# Patient Record
Sex: Male | Born: 1960 | Race: White | Hispanic: No | Marital: Single | Smoking: Former smoker
Health system: Southern US, Community
[De-identification: ages and names within clinical notes are randomized; demographics above are authoritative.]

## PROBLEM LIST (undated history)

## (undated) DIAGNOSIS — J449 Chronic obstructive pulmonary disease, unspecified: Secondary | ICD-10-CM

## (undated) DIAGNOSIS — I10 Essential (primary) hypertension: Secondary | ICD-10-CM

## (undated) HISTORY — PX: EYE SURGERY: SHX253

---

## 2004-10-28 ENCOUNTER — Ambulatory Visit (HOSPITAL_COMMUNITY): Admission: RE | Admit: 2004-10-28 | Discharge: 2004-10-28 | Payer: Self-pay | Admitting: Gastroenterology

## 2010-10-02 ENCOUNTER — Other Ambulatory Visit: Payer: Self-pay | Admitting: Family Medicine

## 2014-04-05 ENCOUNTER — Other Ambulatory Visit: Payer: Self-pay | Admitting: Gastroenterology

## 2016-07-31 DIAGNOSIS — H401131 Primary open-angle glaucoma, bilateral, mild stage: Secondary | ICD-10-CM | POA: Diagnosis not present

## 2016-08-04 DIAGNOSIS — H401134 Primary open-angle glaucoma, bilateral, indeterminate stage: Secondary | ICD-10-CM | POA: Diagnosis not present

## 2016-08-04 DIAGNOSIS — Z961 Presence of intraocular lens: Secondary | ICD-10-CM | POA: Diagnosis not present

## 2016-08-04 DIAGNOSIS — H2512 Age-related nuclear cataract, left eye: Secondary | ICD-10-CM | POA: Diagnosis not present

## 2016-09-10 DIAGNOSIS — H2512 Age-related nuclear cataract, left eye: Secondary | ICD-10-CM | POA: Diagnosis not present

## 2016-09-10 DIAGNOSIS — H401131 Primary open-angle glaucoma, bilateral, mild stage: Secondary | ICD-10-CM | POA: Diagnosis not present

## 2016-09-10 DIAGNOSIS — Z961 Presence of intraocular lens: Secondary | ICD-10-CM | POA: Diagnosis not present

## 2016-10-28 DIAGNOSIS — Z0000 Encounter for general adult medical examination without abnormal findings: Secondary | ICD-10-CM | POA: Diagnosis not present

## 2016-10-30 DIAGNOSIS — E7800 Pure hypercholesterolemia, unspecified: Secondary | ICD-10-CM | POA: Diagnosis not present

## 2016-11-29 DIAGNOSIS — Z23 Encounter for immunization: Secondary | ICD-10-CM | POA: Diagnosis not present

## 2017-03-24 DIAGNOSIS — L719 Rosacea, unspecified: Secondary | ICD-10-CM | POA: Diagnosis not present

## 2017-03-24 DIAGNOSIS — D1801 Hemangioma of skin and subcutaneous tissue: Secondary | ICD-10-CM | POA: Diagnosis not present

## 2017-03-25 ENCOUNTER — Other Ambulatory Visit: Payer: Self-pay | Admitting: Family Medicine

## 2017-03-25 DIAGNOSIS — D1801 Hemangioma of skin and subcutaneous tissue: Secondary | ICD-10-CM | POA: Diagnosis not present

## 2017-06-26 DIAGNOSIS — Z23 Encounter for immunization: Secondary | ICD-10-CM | POA: Diagnosis not present

## 2017-07-30 DIAGNOSIS — Z23 Encounter for immunization: Secondary | ICD-10-CM | POA: Diagnosis not present

## 2017-07-30 DIAGNOSIS — J3089 Other allergic rhinitis: Secondary | ICD-10-CM | POA: Diagnosis not present

## 2017-10-05 DIAGNOSIS — J040 Acute laryngitis: Secondary | ICD-10-CM | POA: Diagnosis not present

## 2017-11-03 DIAGNOSIS — I10 Essential (primary) hypertension: Secondary | ICD-10-CM | POA: Diagnosis not present

## 2017-11-03 DIAGNOSIS — M7711 Lateral epicondylitis, right elbow: Secondary | ICD-10-CM | POA: Diagnosis not present

## 2017-11-03 DIAGNOSIS — R05 Cough: Secondary | ICD-10-CM | POA: Diagnosis not present

## 2017-11-03 DIAGNOSIS — Z0000 Encounter for general adult medical examination without abnormal findings: Secondary | ICD-10-CM | POA: Diagnosis not present

## 2017-11-03 DIAGNOSIS — L719 Rosacea, unspecified: Secondary | ICD-10-CM | POA: Diagnosis not present

## 2017-11-08 DIAGNOSIS — E7800 Pure hypercholesterolemia, unspecified: Secondary | ICD-10-CM | POA: Diagnosis not present

## 2017-11-08 DIAGNOSIS — I10 Essential (primary) hypertension: Secondary | ICD-10-CM | POA: Diagnosis not present

## 2017-11-30 DIAGNOSIS — H401131 Primary open-angle glaucoma, bilateral, mild stage: Secondary | ICD-10-CM | POA: Diagnosis not present

## 2017-12-15 DIAGNOSIS — Z961 Presence of intraocular lens: Secondary | ICD-10-CM | POA: Diagnosis not present

## 2017-12-15 DIAGNOSIS — H25012 Cortical age-related cataract, left eye: Secondary | ICD-10-CM | POA: Diagnosis not present

## 2017-12-15 DIAGNOSIS — H2512 Age-related nuclear cataract, left eye: Secondary | ICD-10-CM | POA: Diagnosis not present

## 2018-03-02 DIAGNOSIS — H5212 Myopia, left eye: Secondary | ICD-10-CM | POA: Diagnosis not present

## 2018-03-02 DIAGNOSIS — H25812 Combined forms of age-related cataract, left eye: Secondary | ICD-10-CM | POA: Diagnosis not present

## 2018-03-02 DIAGNOSIS — H25012 Cortical age-related cataract, left eye: Secondary | ICD-10-CM | POA: Diagnosis not present

## 2018-03-02 DIAGNOSIS — H2512 Age-related nuclear cataract, left eye: Secondary | ICD-10-CM | POA: Diagnosis not present

## 2018-03-02 DIAGNOSIS — H52222 Regular astigmatism, left eye: Secondary | ICD-10-CM | POA: Diagnosis not present

## 2018-03-02 DIAGNOSIS — H25042 Posterior subcapsular polar age-related cataract, left eye: Secondary | ICD-10-CM | POA: Diagnosis not present

## 2018-11-30 DIAGNOSIS — E7800 Pure hypercholesterolemia, unspecified: Secondary | ICD-10-CM | POA: Diagnosis not present

## 2018-11-30 DIAGNOSIS — G250 Essential tremor: Secondary | ICD-10-CM | POA: Diagnosis not present

## 2018-11-30 DIAGNOSIS — L719 Rosacea, unspecified: Secondary | ICD-10-CM | POA: Diagnosis not present

## 2018-11-30 DIAGNOSIS — Z23 Encounter for immunization: Secondary | ICD-10-CM | POA: Diagnosis not present

## 2018-11-30 DIAGNOSIS — I10 Essential (primary) hypertension: Secondary | ICD-10-CM | POA: Diagnosis not present

## 2018-11-30 DIAGNOSIS — Z125 Encounter for screening for malignant neoplasm of prostate: Secondary | ICD-10-CM | POA: Diagnosis not present

## 2018-11-30 DIAGNOSIS — Z0000 Encounter for general adult medical examination without abnormal findings: Secondary | ICD-10-CM | POA: Diagnosis not present

## 2019-01-31 DIAGNOSIS — Z1159 Encounter for screening for other viral diseases: Secondary | ICD-10-CM | POA: Diagnosis not present

## 2019-05-02 DIAGNOSIS — Z23 Encounter for immunization: Secondary | ICD-10-CM | POA: Diagnosis not present

## 2019-10-05 DIAGNOSIS — R0789 Other chest pain: Secondary | ICD-10-CM | POA: Diagnosis not present

## 2019-10-05 DIAGNOSIS — Z79899 Other long term (current) drug therapy: Secondary | ICD-10-CM | POA: Diagnosis not present

## 2019-10-05 DIAGNOSIS — J984 Other disorders of lung: Secondary | ICD-10-CM | POA: Diagnosis not present

## 2019-10-05 DIAGNOSIS — R079 Chest pain, unspecified: Secondary | ICD-10-CM | POA: Diagnosis not present

## 2019-10-05 DIAGNOSIS — S2232XA Fracture of one rib, left side, initial encounter for closed fracture: Secondary | ICD-10-CM | POA: Diagnosis not present

## 2019-10-05 DIAGNOSIS — Z20822 Contact with and (suspected) exposure to covid-19: Secondary | ICD-10-CM | POA: Diagnosis not present

## 2019-10-05 DIAGNOSIS — J9383 Other pneumothorax: Secondary | ICD-10-CM | POA: Diagnosis not present

## 2019-10-05 DIAGNOSIS — Z7982 Long term (current) use of aspirin: Secondary | ICD-10-CM | POA: Diagnosis not present

## 2019-10-05 DIAGNOSIS — I10 Essential (primary) hypertension: Secondary | ICD-10-CM | POA: Diagnosis not present

## 2019-10-05 DIAGNOSIS — Z87891 Personal history of nicotine dependence: Secondary | ICD-10-CM | POA: Diagnosis not present

## 2019-10-05 DIAGNOSIS — I4510 Unspecified right bundle-branch block: Secondary | ICD-10-CM | POA: Diagnosis not present

## 2019-10-05 DIAGNOSIS — X58XXXA Exposure to other specified factors, initial encounter: Secondary | ICD-10-CM | POA: Diagnosis not present

## 2019-10-05 DIAGNOSIS — J449 Chronic obstructive pulmonary disease, unspecified: Secondary | ICD-10-CM | POA: Diagnosis not present

## 2019-10-06 DIAGNOSIS — J939 Pneumothorax, unspecified: Secondary | ICD-10-CM | POA: Diagnosis not present

## 2019-10-06 DIAGNOSIS — J9383 Other pneumothorax: Secondary | ICD-10-CM | POA: Diagnosis not present

## 2019-10-06 DIAGNOSIS — J449 Chronic obstructive pulmonary disease, unspecified: Secondary | ICD-10-CM | POA: Diagnosis not present

## 2019-10-18 DIAGNOSIS — J939 Pneumothorax, unspecified: Secondary | ICD-10-CM | POA: Diagnosis not present

## 2019-11-30 DIAGNOSIS — Z13220 Encounter for screening for lipoid disorders: Secondary | ICD-10-CM | POA: Diagnosis not present

## 2019-11-30 DIAGNOSIS — Z0000 Encounter for general adult medical examination without abnormal findings: Secondary | ICD-10-CM | POA: Diagnosis not present

## 2019-11-30 DIAGNOSIS — Z125 Encounter for screening for malignant neoplasm of prostate: Secondary | ICD-10-CM | POA: Diagnosis not present

## 2019-12-05 DIAGNOSIS — G250 Essential tremor: Secondary | ICD-10-CM | POA: Diagnosis not present

## 2019-12-05 DIAGNOSIS — Z0000 Encounter for general adult medical examination without abnormal findings: Secondary | ICD-10-CM | POA: Diagnosis not present

## 2019-12-05 DIAGNOSIS — M25562 Pain in left knee: Secondary | ICD-10-CM | POA: Diagnosis not present

## 2019-12-05 DIAGNOSIS — L719 Rosacea, unspecified: Secondary | ICD-10-CM | POA: Diagnosis not present

## 2019-12-05 DIAGNOSIS — I10 Essential (primary) hypertension: Secondary | ICD-10-CM | POA: Diagnosis not present

## 2020-02-12 DIAGNOSIS — H524 Presbyopia: Secondary | ICD-10-CM | POA: Diagnosis not present

## 2020-05-14 DIAGNOSIS — Z23 Encounter for immunization: Secondary | ICD-10-CM | POA: Diagnosis not present

## 2020-09-30 DIAGNOSIS — H33012 Retinal detachment with single break, left eye: Secondary | ICD-10-CM | POA: Diagnosis not present

## 2020-09-30 DIAGNOSIS — H35413 Lattice degeneration of retina, bilateral: Secondary | ICD-10-CM | POA: Diagnosis not present

## 2020-10-02 DIAGNOSIS — Z7982 Long term (current) use of aspirin: Secondary | ICD-10-CM | POA: Diagnosis not present

## 2020-10-02 DIAGNOSIS — Z961 Presence of intraocular lens: Secondary | ICD-10-CM | POA: Diagnosis not present

## 2020-10-02 DIAGNOSIS — H35413 Lattice degeneration of retina, bilateral: Secondary | ICD-10-CM | POA: Diagnosis not present

## 2020-10-02 DIAGNOSIS — Z20822 Contact with and (suspected) exposure to covid-19: Secondary | ICD-10-CM | POA: Diagnosis not present

## 2020-10-02 DIAGNOSIS — I10 Essential (primary) hypertension: Secondary | ICD-10-CM | POA: Diagnosis not present

## 2020-10-02 DIAGNOSIS — Z9841 Cataract extraction status, right eye: Secondary | ICD-10-CM | POA: Diagnosis not present

## 2020-10-02 DIAGNOSIS — Z79899 Other long term (current) drug therapy: Secondary | ICD-10-CM | POA: Diagnosis not present

## 2020-10-02 DIAGNOSIS — Z9842 Cataract extraction status, left eye: Secondary | ICD-10-CM | POA: Diagnosis not present

## 2020-10-02 DIAGNOSIS — H3342 Traction detachment of retina, left eye: Secondary | ICD-10-CM | POA: Diagnosis not present

## 2020-10-02 DIAGNOSIS — Z87891 Personal history of nicotine dependence: Secondary | ICD-10-CM | POA: Diagnosis not present

## 2020-10-02 DIAGNOSIS — H5462 Unqualified visual loss, left eye, normal vision right eye: Secondary | ICD-10-CM | POA: Diagnosis not present

## 2020-10-03 DIAGNOSIS — H3322 Serous retinal detachment, left eye: Secondary | ICD-10-CM | POA: Diagnosis not present

## 2020-10-03 DIAGNOSIS — H5462 Unqualified visual loss, left eye, normal vision right eye: Secondary | ICD-10-CM | POA: Diagnosis not present

## 2020-10-03 DIAGNOSIS — Z7982 Long term (current) use of aspirin: Secondary | ICD-10-CM | POA: Diagnosis not present

## 2020-10-03 DIAGNOSIS — Z20822 Contact with and (suspected) exposure to covid-19: Secondary | ICD-10-CM | POA: Diagnosis not present

## 2020-10-03 DIAGNOSIS — Z961 Presence of intraocular lens: Secondary | ICD-10-CM | POA: Diagnosis not present

## 2020-10-03 DIAGNOSIS — H3342 Traction detachment of retina, left eye: Secondary | ICD-10-CM | POA: Diagnosis not present

## 2020-10-03 DIAGNOSIS — Z9841 Cataract extraction status, right eye: Secondary | ICD-10-CM | POA: Diagnosis not present

## 2020-10-03 DIAGNOSIS — H35413 Lattice degeneration of retina, bilateral: Secondary | ICD-10-CM | POA: Diagnosis not present

## 2020-10-03 DIAGNOSIS — Z87891 Personal history of nicotine dependence: Secondary | ICD-10-CM | POA: Diagnosis not present

## 2020-10-03 DIAGNOSIS — I10 Essential (primary) hypertension: Secondary | ICD-10-CM | POA: Diagnosis not present

## 2020-10-03 DIAGNOSIS — Z9842 Cataract extraction status, left eye: Secondary | ICD-10-CM | POA: Diagnosis not present

## 2020-10-03 DIAGNOSIS — Z79899 Other long term (current) drug therapy: Secondary | ICD-10-CM | POA: Diagnosis not present

## 2020-10-03 DIAGNOSIS — H33012 Retinal detachment with single break, left eye: Secondary | ICD-10-CM | POA: Diagnosis not present

## 2020-10-03 DIAGNOSIS — H35412 Lattice degeneration of retina, left eye: Secondary | ICD-10-CM | POA: Diagnosis not present

## 2020-11-05 DIAGNOSIS — Z87891 Personal history of nicotine dependence: Secondary | ICD-10-CM | POA: Diagnosis not present

## 2020-11-05 DIAGNOSIS — Z9841 Cataract extraction status, right eye: Secondary | ICD-10-CM | POA: Diagnosis not present

## 2020-11-05 DIAGNOSIS — Z961 Presence of intraocular lens: Secondary | ICD-10-CM | POA: Diagnosis not present

## 2020-11-05 DIAGNOSIS — Z20822 Contact with and (suspected) exposure to covid-19: Secondary | ICD-10-CM | POA: Diagnosis not present

## 2020-11-05 DIAGNOSIS — Z79899 Other long term (current) drug therapy: Secondary | ICD-10-CM | POA: Diagnosis not present

## 2020-11-05 DIAGNOSIS — Z7952 Long term (current) use of systemic steroids: Secondary | ICD-10-CM | POA: Diagnosis not present

## 2020-11-05 DIAGNOSIS — H35412 Lattice degeneration of retina, left eye: Secondary | ICD-10-CM | POA: Diagnosis not present

## 2020-11-05 DIAGNOSIS — H3342 Traction detachment of retina, left eye: Secondary | ICD-10-CM | POA: Diagnosis not present

## 2020-11-05 DIAGNOSIS — Z9842 Cataract extraction status, left eye: Secondary | ICD-10-CM | POA: Diagnosis not present

## 2020-11-05 DIAGNOSIS — H409 Unspecified glaucoma: Secondary | ICD-10-CM | POA: Diagnosis not present

## 2020-11-05 DIAGNOSIS — I10 Essential (primary) hypertension: Secondary | ICD-10-CM | POA: Diagnosis not present

## 2020-11-05 DIAGNOSIS — Z7982 Long term (current) use of aspirin: Secondary | ICD-10-CM | POA: Diagnosis not present

## 2020-11-07 DIAGNOSIS — H33022 Retinal detachment with multiple breaks, left eye: Secondary | ICD-10-CM | POA: Diagnosis not present

## 2020-11-07 DIAGNOSIS — Z961 Presence of intraocular lens: Secondary | ICD-10-CM | POA: Diagnosis not present

## 2020-11-07 DIAGNOSIS — H35412 Lattice degeneration of retina, left eye: Secondary | ICD-10-CM | POA: Diagnosis not present

## 2020-11-07 DIAGNOSIS — Z9841 Cataract extraction status, right eye: Secondary | ICD-10-CM | POA: Diagnosis not present

## 2020-11-07 DIAGNOSIS — Z20822 Contact with and (suspected) exposure to covid-19: Secondary | ICD-10-CM | POA: Diagnosis not present

## 2020-11-07 DIAGNOSIS — H3342 Traction detachment of retina, left eye: Secondary | ICD-10-CM | POA: Diagnosis not present

## 2020-11-07 DIAGNOSIS — I10 Essential (primary) hypertension: Secondary | ICD-10-CM | POA: Diagnosis not present

## 2020-11-07 DIAGNOSIS — H409 Unspecified glaucoma: Secondary | ICD-10-CM | POA: Diagnosis not present

## 2020-11-07 DIAGNOSIS — Z87891 Personal history of nicotine dependence: Secondary | ICD-10-CM | POA: Diagnosis not present

## 2020-11-07 DIAGNOSIS — H3341 Traction detachment of retina, right eye: Secondary | ICD-10-CM | POA: Diagnosis not present

## 2020-11-07 DIAGNOSIS — Z7952 Long term (current) use of systemic steroids: Secondary | ICD-10-CM | POA: Diagnosis not present

## 2020-11-07 DIAGNOSIS — Z7982 Long term (current) use of aspirin: Secondary | ICD-10-CM | POA: Diagnosis not present

## 2020-11-07 DIAGNOSIS — Z9842 Cataract extraction status, left eye: Secondary | ICD-10-CM | POA: Diagnosis not present

## 2020-11-07 DIAGNOSIS — Z79899 Other long term (current) drug therapy: Secondary | ICD-10-CM | POA: Diagnosis not present

## 2020-11-21 DIAGNOSIS — H40032 Anatomical narrow angle, left eye: Secondary | ICD-10-CM | POA: Diagnosis not present

## 2020-11-21 DIAGNOSIS — H26492 Other secondary cataract, left eye: Secondary | ICD-10-CM | POA: Diagnosis not present

## 2020-12-12 DIAGNOSIS — Z0000 Encounter for general adult medical examination without abnormal findings: Secondary | ICD-10-CM | POA: Diagnosis not present

## 2020-12-12 DIAGNOSIS — Z13220 Encounter for screening for lipoid disorders: Secondary | ICD-10-CM | POA: Diagnosis not present

## 2020-12-12 DIAGNOSIS — E559 Vitamin D deficiency, unspecified: Secondary | ICD-10-CM | POA: Diagnosis not present

## 2020-12-12 DIAGNOSIS — Z125 Encounter for screening for malignant neoplasm of prostate: Secondary | ICD-10-CM | POA: Diagnosis not present

## 2020-12-19 DIAGNOSIS — Z0000 Encounter for general adult medical examination without abnormal findings: Secondary | ICD-10-CM | POA: Diagnosis not present

## 2020-12-19 DIAGNOSIS — E538 Deficiency of other specified B group vitamins: Secondary | ICD-10-CM | POA: Diagnosis not present

## 2020-12-19 DIAGNOSIS — I10 Essential (primary) hypertension: Secondary | ICD-10-CM | POA: Diagnosis not present

## 2020-12-19 DIAGNOSIS — E559 Vitamin D deficiency, unspecified: Secondary | ICD-10-CM | POA: Diagnosis not present

## 2020-12-19 DIAGNOSIS — E7800 Pure hypercholesterolemia, unspecified: Secondary | ICD-10-CM | POA: Diagnosis not present

## 2020-12-30 ENCOUNTER — Other Ambulatory Visit: Payer: Self-pay | Admitting: *Deleted

## 2020-12-30 DIAGNOSIS — Z87891 Personal history of nicotine dependence: Secondary | ICD-10-CM

## 2021-02-05 ENCOUNTER — Other Ambulatory Visit: Payer: Self-pay

## 2021-02-05 ENCOUNTER — Ambulatory Visit
Admission: RE | Admit: 2021-02-05 | Discharge: 2021-02-05 | Disposition: A | Discharge status: Home / Self Care | Payer: BC Managed Care – PPO | Source: Ambulatory Visit | Attending: Acute Care | Admitting: Acute Care

## 2021-02-05 ENCOUNTER — Ambulatory Visit (INDEPENDENT_AMBULATORY_CARE_PROVIDER_SITE_OTHER): Payer: BC Managed Care – PPO | Admitting: Acute Care

## 2021-02-05 ENCOUNTER — Encounter: Payer: Self-pay | Admitting: Acute Care

## 2021-02-05 VITALS — BP 124/78 | HR 87 | Temp 97.3°F | Ht 70.0 in | Wt 160.0 lb

## 2021-02-05 DIAGNOSIS — Z87891 Personal history of nicotine dependence: Secondary | ICD-10-CM

## 2021-02-05 NOTE — Progress Notes (Signed)
Shared Decision Making Visit Lung Cancer Screening Program [phone])   Eligibility: Age 60 y.o. Pack Years Smoking History Calculation 35 pack year smoking history (# packs/per year x # years smoked) Recent History of coughing up blood  no Unexplained weight loss? no ( >Than 15 pounds within the last 6 months ) Prior History Lung / other cancer no (Diagnosis within the last 5 years already requiring surveillance chest CT Scans). Smoking Status Former Smoker Former Smokers: Years since quit: 8 years  Quit Date: 2014  Visit Components: Discussion included one or more decision making aids. yes Discussion included risk/benefits of screening. yes Discussion included potential follow up diagnostic testing for abnormal scans. yes Discussion included meaning and risk of over diagnosis. yes Discussion included meaning and risk of False Positives. yes Discussion included meaning of total radiation exposure. yes  Counseling Included: Importance of adherence to annual lung cancer LDCT screening. yes Impact of comorbidities on ability to participate in the program. yes Ability and willingness to under diagnostic treatment. yes  Smoking Cessation Counseling: Current Smokers:  Discussed importance of smoking cessation. yes Information about tobacco cessation classes and interventions provided to patient. yes Patient provided with "ticket" for LDCT Scan. yes Symptomatic Patient. no  Counseling Diagnosis Code: Tobacco Use Z72.0 Asymptomatic Patient yes  Counseling (Intermediate counseling: > three minutes counseling) U0454 Former Smokers:  Discussed the importance of maintaining cigarette abstinence. yes Diagnosis Code: Personal History of Nicotine Dependence. U98.119 Information about tobacco cessation classes and interventions provided to patient. Yes Patient provided with "ticket" for LDCT Scan. yes Written Order for Lung Cancer Screening with LDCT placed in Epic. Yes (CT Chest Lung  Cancer Screening Low Dose W/O CM) [id number] Z12.2-Screening of respiratory organs Z87.891-Personal history of nicotine dependence  I spent 25 minutes of face to face time with Thomas Kennedy discussing the risks and benefits of lung cancer screening. We viewed a power point together that explained in detail the above noted topics. We took the time to pause the power point at intervals to allow for questions to be asked and answered to ensure understanding. We discussed that he had taken the single most powerful action possible to decrease his risk of developing lung cancer when he quit smoking. I counseled him to remain smoke free, and to contact me if he ever had the desire to smoke again so that I can provide resources and tools to help support the effort to remain smoke free. We discussed the time and location of the scan, and that either  Doroteo Glassman RN or I will call with the results within  24-48 hours of receiving them. He has my card and contact information in the event he needs to speak with me, in addition to a copy of the power point we reviewed as a resource. He verbalized understanding of all of the above and had no further questions upon leaving the office.     I explained to the patient that there has been a high incidence of coronary artery disease noted on these exams. I explained that this is a non-gated exam therefore degree or severity cannot be determined. This patient is not currently on statin therapy. I have asked the patient to follow-up with their PCP regarding any incidental finding of coronary artery disease and management with diet or medication as they feel is clinically indicated. The patient verbalized understanding of the above and had no further questions.     Thomas Spatz, NP 02/05/2021

## 2021-02-05 NOTE — Patient Instructions (Signed)
Thank you for participating in the [facility]. It was our pleasure to meet you today. We will call you with the results of your scan within the next few days. Your scan will be assigned a Lung RADS category score by the physicians reading the scans.  This Lung RADS score determines follow up scanning.  See below for description of categories, and follow up screening recommendations. We will be in touch to schedule your follow up screening annually or based on recommendations of our providers. We will fax a copy of your scan results to your Primary Care Physician, or the physician who referred you to the program, to ensure they have the results. Please call the office if you have any questions or concerns regarding your scanning experience or results.  Our office number is [phone]. Please speak with Denise Phelps, RN. She is our Lung Cancer Screening RN. If she is unavailable when you call, please have the office staff send her a message. She will return your call at her earliest convenience. Remember, if your scan is normal, we will scan you annually as long as you continue to meet the criteria for the program. (Age 55-77, Current smoker or smoker who has quit within the last 15 years). If you are a smoker, remember, quitting is the single most powerful action that you can take to decrease your risk of lung cancer and other pulmonary, breathing related problems. We know quitting is hard, and we are here to help.  Please let us know if there is anything we can do to help you meet your goal of quitting. If you are a former smoker, congratulations. We are proud of you! Remain smoke free! Remember you can refer friends or family members through the number above.  We will screen them to make sure they meet criteria for the program. Thank you for helping us take better care of you by participating in Lung Screening.  Lung RADS Categories:  Lung RADS 1: no nodules  or definitely non-concerning nodules.  Recommendation is for a repeat annual scan in 12 months.  Lung RADS 2:  nodules that are non-concerning in appearance and behavior with a very low likelihood of becoming an active cancer. Recommendation is for a repeat annual scan in 12 months.  Lung RADS 3: nodules that are probably non-concerning , includes nodules with a low likelihood of becoming an active cancer.  Recommendation is for a 6-month repeat screening scan. Often noted after an upper respiratory illness. We will be in touch to make sure you have no questions, and to schedule your 6-month scan.  Lung RADS 4 A: nodules with concerning findings, recommendation is most often for a follow up scan in 3 months or additional testing based on our provider's assessment of the scan. We will be in touch to make sure you have no questions and to schedule the recommended 3 month follow up scan.  Lung RADS 4 B:  indicates findings that are concerning. We will be in touch with you to schedule additional diagnostic testing based on our provider's  assessment of the scan.   

## 2021-02-10 DIAGNOSIS — E538 Deficiency of other specified B group vitamins: Secondary | ICD-10-CM | POA: Diagnosis not present

## 2021-02-12 NOTE — Progress Notes (Signed)
Cardiology Office Note:    Date:  02/13/2021   ID:  Thomas Kennedy, DOB [birth year], MRN [id number]  PCP:  Lawerance Cruel, MD  Cardiologist:  None  Electrophysiologist:  None   Referring MD: Lawerance Cruel, MD   No chief complaint on file.   History of Present Illness:    Thomas Kennedy is a 60 y.o. male with tobacco use, hypertension who is referred by Dr. Harrington Challenger for evaluation of coronary calcifications on CT chest.  He states he has hypertension. He says when he takes his irbesartan 366 MG  his systolic blood pressure ranges 440H-474 and diastolic 25Z-56L. He experiences occasional shortness of breath after showering, hot flashes, and palpitations that comes once a week and lasts about 5-10 minutes. He feels his heart racing when he has these episodes. He also has occasional lightheadedness. He includes he had a presyncopal episode when he went to the grocery store and had to hold on to the shelf for support. Denies syncope, LE edema or chest pains. He use to exercise regularly for about 30-40 minutes with no complications however has stopped 5 months ago. His father died from bladder cancer. He notes his father was not taking medication. His grandmother died from a massive MI. He smoked one pack of cigarettes a day for 35 years however has stopped smoking 8 years ago.    No past medical history on file.  No past surgical history on file.  Current Medications: Current Meds  Medication Sig   brimonidine (ALPHAGAN) 0.2 % ophthalmic solution Apply to eye.   cyanocobalamin (,VITAMIN B-12,) 1000 MCG/ML injection Inject 1,000 mcg into the muscle every 30 (thirty) days.   irbesartan (AVAPRO) 150 MG tablet    latanoprost (XALATAN) 0.005 % ophthalmic solution 1 drop at bedtime.     Allergies:   Patient has no allergy information on record.   Social History   Socioeconomic History   Marital status: Single    Spouse name: Not on file   Number of children: Not on file   Years of  education: Not on file   Highest education level: Not on file  Occupational History   Not on file  Tobacco Use   Smoking status: Former    Types: Cigarettes    Quit date: 2014    Years since quitting: 8.5   Smokeless tobacco: Former  Substance and Sexual Activity   Alcohol use: Not on file   Drug use: Not on file   Sexual activity: Not on file  Other Topics Concern   Not on file  Social History Narrative   Not on file   Social Determinants of Health   Financial Resource Strain: Not on file  Food Insecurity: Not on file  Transportation Needs: Not on file  Physical Activity: Not on file  Stress: Not on file  Social Connections: Not on file     Family History: The patient's family history is not on file.  ROS:   Please see the history of present illness.    (+)lightheadedness (+) hot flashes (+)shortness of breath  (+) palpitations  All other systems reviewed and are negative.  EKGs/Labs/Other Studies Reviewed:    No prior CV studies available.  EKG:   07/22: sinus rhythm, rate 67, no ST abnormalities, Q-wave in D1 and D2   Recent Labs: No results found for requested labs within last 8760 hours.  Recent Lipid Panel No results found for: CHOL, TRIG, HDL, CHOLHDL, VLDL, LDLCALC, LDLDIRECT  Physical  Exam:    VS:  BP 112/90 (BP Location: Right Arm)   Pulse 67   Ht 5\' 10"  (1.778 m)   Wt 160 lb 3.2 oz (72.7 kg)   SpO2 98%   BMI 22.99 kg/m     Wt Readings from Last 3 Encounters:  02/13/21 160 lb 3.2 oz (72.7 kg)  02/05/21 160 lb (72.6 kg)     GEN:  Well nourished, well developed in no acute distress HEENT: Normal NECK: No JVD; No carotid bruits LYMPHATICS: No lymphadenopathy CARDIAC: RRR, no murmurs, rubs, gallops RESPIRATORY:  Clear to auscultation without rales, wheezing or rhonchi  ABDOMEN: Soft, non-tender, non-distended MUSCULOSKELETAL:  No edema; No deformity  SKIN: Warm and dry NEUROLOGIC:  Alert and oriented x 3 PSYCHIATRIC:  Normal affect    ASSESSMENT:    1. Shortness of breath   2. Abnormal EKG   3. Aortic dilatation (HCC)   4. Palpitations   5. Hyperlipidemia, unspecified hyperlipidemia type    PLAN:    Dyspnea: Reports intermittent dyspnea, EKG with Q waves in V1/2.  Will check echocardiogram   Hyperlipidemia: LDL 132 on 12/12/2020.  10-year ASCVD risk score 5%.  Does not meet indication for statin at this time but did have coronary calcifications on recent CT chest.  Will check calcium score and plan to start statin if elevated calcium score  Aortic dilatation: Ascending aorta measured 40 mm on recent CT chest, will repeat CTA chest in 1 year to monitor  Palpitations: Description concerning for arrhythmia, evaluate with Zio patch x2 weeks  RTC in 6 months   Medication Adjustments/Labs and Tests Ordered: Current medicines are reviewed at length with the patient today.  Concerns regarding medicines are outlined above.  Orders Placed This Encounter  Procedures   CT CARDIAC SCORING (SELF PAY ONLY)   CT ANGIO CHEST AORTA W/CM & OR WO/CM   LONG TERM MONITOR (3-14 DAYS)   EKG 12-Lead   ECHOCARDIOGRAM COMPLETE    No orders of the defined types were placed in this encounter.   Patient Instructions  Medication Instructions:  Your physician recommends that you continue on your current medications as directed. Please refer to the Current Medication list given to you today.  *If you need a refill on your cardiac medications before your next appointment, please call your pharmacy*  Testing/Procedures: Your physician has requested that you have an echocardiogram. Echocardiography is a painless test that uses sound waves to create images of your heart. It provides your doctor with information about the size and shape of your heart and how well your heart's chambers and valves are working. This procedure takes approximately one hour. There are no restrictions for this procedure. This will be done at our [facility]  location:  [address]  CT coronary calcium score. This test is done at [address]. This is $99 out of pocket.   Coronary CalciumScan A coronary calcium scan is an imaging test used to look for deposits of calcium and other fatty materials (plaques) in the inner lining of the blood vessels of the heart (coronary arteries). These deposits of calcium and plaques can partly clog and narrow the coronary arteries without producing any symptoms or warning signs. This puts a person at risk for a heart attack. This test can detect these deposits before symptoms develop. Tell a health care provider about: Any allergies you have. All medicines you are taking, including vitamins, herbs, eye drops, creams, and over-the-counter  medicines. Any problems you or family members have had with anesthetic medicines. Any blood disorders you have. Any surgeries you have had. Any medical conditions you have. Whether you are pregnant or may be pregnant. What are the risks? Generally, this is a safe procedure. However, problems may occur, including: Harm to a pregnant woman and her unborn baby. This test involves the use of radiation. Radiation exposure can be dangerous to a pregnant woman and her unborn baby. If you are pregnant, you generally should not have this procedure done. Slight increase in the risk of cancer. This is because of the radiation involved in the test. What happens before the procedure? No preparation is needed for this procedure. What happens during the procedure? You will undress and remove any jewelry around your neck or chest. You will put on a hospital gown. Sticky electrodes will be placed on your chest. The electrodes will be connected to an electrocardiogram (ECG) machine to record a tracing of the electrical activity of your heart. A CT scanner will take pictures of your heart. During this time, you will be asked to lie still and hold your breath  for 2-3 seconds while a picture of your heart is being taken. The procedure may vary among health care providers and hospitals. What happens after the procedure? You can get dressed. You can return to your normal activities. It is up to you to get the results of your test. Ask your health care provider, or the department that is doing the test, when your results will be ready. Summary A coronary calcium scan is an imaging test used to look for deposits of calcium and other fatty materials (plaques) in the inner lining of the blood vessels of the heart (coronary arteries). Generally, this is a safe procedure. Tell your health care provider if you are pregnant or may be pregnant. No preparation is needed for this procedure. A CT scanner will take pictures of your heart. You can return to your normal activities after the scan is done. This information is not intended to replace advice given to you by your health care provider. Make sure you discuss any questions you have with your health care provider. Document Released: 01/09/2008 Document Revised: 06/01/2016 Document Reviewed: 06/01/2016 Elsevier Interactive Patient Education  2017 [place] [employer] Monitor Instructions   Your physician has requested you wear a ZIO patch monitor for _14__ days.  This is a single patch monitor.   [employer] supplies one patch monitor per enrollment. Additional stickers are not available. Please do not apply patch if you will be having a Nuclear Stress Test, Echocardiogram, Cardiac CT, MRI, or Chest Xray during the period you would be wearing the monitor. The patch cannot be worn during these tests. You cannot remove and re-apply the ZIO XT patch monitor.  Your ZIO patch monitor will be sent Fed Ex from [employer] directly to your home address. It may take 3-5 days to receive your monitor after you have been enrolled.  Once you have received your monitor, please review the enclosed  instructions. Your monitor has already been registered assigning a specific monitor serial # to you.  Billing and Patient Assistance Program Information   We have supplied [employer] with any of your insurance information on file for billing purposes. [employer] offers a sliding scale Patient Assistance Program for patients that do not have insurance, or whose insurance does not completely cover the cost of the ZIO monitor.   You must apply  for the Patient Assistance Program to qualify for this discounted rate.     To apply, please call [employer] at [phone], select option 4, then select option 2, and ask to apply for Patient Assistance Program.  Theodore Demark will ask your household income, and how many people are in your household.  They will quote your out-of-pocket cost based on that information.  [employer] will also be able to set up a 6-month, interest-free payment plan if needed.  Applying the monitor   Shave hair from upper left chest.  Hold abrader disc by orange tab. Rub abrader in 40 strokes over the upper left chest as indicated in your monitor instructions.  Clean area with 4 enclosed alcohol pads. Let dry.  Apply patch as indicated in monitor instructions. Patch will be placed under collarbone on left side of chest with arrow pointing upward.  Rub patch adhesive wings for 2 minutes. Remove white label marked "1". Remove the white label marked "2". Rub patch adhesive wings for 2 additional minutes.  While looking in a mirror, press and release button in center of patch. A small green light will flash 3-4 times. This will be your only indicator that the monitor has been turned on. ?  Do not shower for the first 24 hours. You may shower after the first 24 hours.  Press the button if you feel a symptom. You will hear a small click. Record Date, Time and Symptom in the Patient Logbook.  When you are ready to remove the patch, follow instructions on the last 2 pages of the Patient Logbook. Stick patch  monitor onto the last page of Patient Logbook.  Place Patient Logbook in the blue and white box.  Use locking tab on box and tape box closed securely.  The blue and white box has prepaid postage on it. Please place it in the mailbox as soon as possible. Your physician should have your test results approximately 7 days after the monitor has been mailed back to [facility].  Call [facility] at [phone] if you have questions regarding your ZIO XT patch monitor. Call them immediately if you see an orange light blinking on your monitor.  If your monitor falls off in less than 4 days, contact our Monitor department at [phone]. ?If your monitor becomes loose or falls off after 4 days call [employer] at [phone] for suggestions on securing your monitor.?   CTA chest/aorta due in 1 year-we will schedule this closer to due date  Follow-Up: At [facility], you and your health needs are our priority.  As part of our continuing mission to provide you with exceptional heart care, we have created designated Provider Care Teams.  These Care Teams include your primary Cardiologist (physician) and Advanced Practice Providers (APPs -  Physician Assistants and Nurse Practitioners) who all work together to provide you with the care you need, when you need it.  We recommend signing up for the patient portal called "MyChart".  Sign up information is provided on this After Visit Summary.  MyChart is used to connect with patients for Virtual Visits (Telemedicine).  Patients are able to view lab/test results, encounter notes, upcoming appointments, etc.  Non-urgent messages can be sent to your provider as well.   To learn more about what you can do with MyChart, go to [email or web address].    Your next appointment:   6 month(s)  The format for your next appointment:   In Person  Provider:   Oswaldo Milian, MD  I,Jada Bradford,acting as a Education administrator for Donato Heinz, MD.,have documented all relevant documentation on the behalf of Donato Heinz, MD,as directed by  Donato Heinz, MD while in the presence of Donato Heinz, MD.  I, Donato Heinz, MD, have reviewed all documentation for this visit. The documentation on 02/13/21 for the exam, diagnosis, procedures, and orders are all accurate and complete.   Signed, Donato Heinz, MD  02/13/2021 1:48 PM    [facility]

## 2021-02-13 ENCOUNTER — Ambulatory Visit (INDEPENDENT_AMBULATORY_CARE_PROVIDER_SITE_OTHER): Payer: BC Managed Care – PPO | Admitting: Cardiology

## 2021-02-13 ENCOUNTER — Other Ambulatory Visit: Payer: Self-pay

## 2021-02-13 ENCOUNTER — Ambulatory Visit (INDEPENDENT_AMBULATORY_CARE_PROVIDER_SITE_OTHER): Payer: BC Managed Care – PPO

## 2021-02-13 ENCOUNTER — Encounter: Payer: Self-pay | Admitting: Cardiology

## 2021-02-13 VITALS — BP 112/90 | HR 67 | Ht 70.0 in | Wt 160.2 lb

## 2021-02-13 DIAGNOSIS — R9431 Abnormal electrocardiogram [ECG] [EKG]: Secondary | ICD-10-CM | POA: Diagnosis not present

## 2021-02-13 DIAGNOSIS — R002 Palpitations: Secondary | ICD-10-CM

## 2021-02-13 DIAGNOSIS — I77819 Aortic ectasia, unspecified site: Secondary | ICD-10-CM

## 2021-02-13 DIAGNOSIS — E785 Hyperlipidemia, unspecified: Secondary | ICD-10-CM

## 2021-02-13 DIAGNOSIS — R0602 Shortness of breath: Secondary | ICD-10-CM | POA: Diagnosis not present

## 2021-02-13 NOTE — Progress Notes (Unsigned)
Enrolled patient for a 14 day Zio XT  monitor to be mailed to patients home  °

## 2021-02-13 NOTE — Patient Instructions (Addendum)
Medication Instructions:  Your physician recommends that you continue on your current medications as directed. Please refer to the Current Medication list given to you today.  *If you need a refill on your cardiac medications before your next appointment, please call your pharmacy*  Testing/Procedures: Your physician has requested that you have an echocardiogram. Echocardiography is a painless test that uses sound waves to create images of your heart. It provides your doctor with information about the size and shape of your heart and how well your heart's chambers and valves are working. This procedure takes approximately one hour. There are no restrictions for this procedure. This will be done at our [facility] location:  [address]  CT coronary calcium score. This test is done at [address]. This is $99 out of pocket.   Coronary CalciumScan A coronary calcium scan is an imaging test used to look for deposits of calcium and other fatty materials (plaques) in the inner lining of the blood vessels of the heart (coronary arteries). These deposits of calcium and plaques can partly clog and narrow the coronary arteries without producing any symptoms or warning signs. This puts a person at risk for a heart attack. This test can detect these deposits before symptoms develop. Tell a health care provider about: Any allergies you have. All medicines you are taking, including vitamins, herbs, eye drops, creams, and over-the-counter medicines. Any problems you or family members have had with anesthetic medicines. Any blood disorders you have. Any surgeries you have had. Any medical conditions you have. Whether you are pregnant or may be pregnant. What are the risks? Generally, this is a safe procedure. However, problems may occur, including: Harm to a pregnant woman and her unborn baby. This test involves the use of radiation. Radiation exposure can be  dangerous to a pregnant woman and her unborn baby. If you are pregnant, you generally should not have this procedure done. Slight increase in the risk of cancer. This is because of the radiation involved in the test. What happens before the procedure? No preparation is needed for this procedure. What happens during the procedure? You will undress and remove any jewelry around your neck or chest. You will put on a hospital gown. Sticky electrodes will be placed on your chest. The electrodes will be connected to an electrocardiogram (ECG) machine to record a tracing of the electrical activity of your heart. A CT scanner will take pictures of your heart. During this time, you will be asked to lie still and hold your breath for 2-3 seconds while a picture of your heart is being taken. The procedure may vary among health care providers and hospitals. What happens after the procedure? You can get dressed. You can return to your normal activities. It is up to you to get the results of your test. Ask your health care provider, or the department that is doing the test, when your results will be ready. Summary A coronary calcium scan is an imaging test used to look for deposits of calcium and other fatty materials (plaques) in the inner lining of the blood vessels of the heart (coronary arteries). Generally, this is a safe procedure. Tell your health care provider if you are pregnant or may be pregnant. No preparation is needed for this procedure. A CT scanner will take pictures of your heart. You can return to your normal activities after the scan is done. This information is not intended to replace advice  given to you by your health care provider. Make sure you discuss any questions you have with your health care provider. Document Released: 01/09/2008 Document Revised: 06/01/2016 Document Reviewed: 06/01/2016 Elsevier Interactive Patient Education  2017 [place] Term Monitor  Instructions   Your physician has requested you wear a ZIO patch monitor for _14__ days.  This is a single patch monitor.   [employer] supplies one patch monitor per enrollment. Additional stickers are not available. Please do not apply patch if you will be having a Nuclear Stress Test, Echocardiogram, Cardiac CT, MRI, or Chest Xray during the period you would be wearing the monitor. The patch cannot be worn during these tests. You cannot remove and re-apply the ZIO XT patch monitor.  Your ZIO patch monitor will be sent Fed Ex from [employer] directly to your home address. It may take 3-5 days to receive your monitor after you have been enrolled.  Once you have received your monitor, please review the enclosed instructions. Your monitor has already been registered assigning a specific monitor serial # to you.  Billing and Patient Assistance Program Information   We have supplied [employer] with any of your insurance information on file for billing purposes. [employer] offers a sliding scale Patient Assistance Program for patients that do not have insurance, or whose insurance does not completely cover the cost of the ZIO monitor.   You must apply for the Patient Assistance Program to qualify for this discounted rate.     To apply, please call [employer] at [phone], select option 4, then select option 2, and ask to apply for Patient Assistance Program.  Theodore Demark will ask your household income, and how many people are in your household.  They will quote your out-of-pocket cost based on that information.  [employer] will also be able to set up a 87-month, interest-free payment plan if needed.  Applying the monitor   Shave hair from upper left chest.  Hold abrader disc by orange tab. Rub abrader in 40 strokes over the upper left chest as indicated in your monitor instructions.  Clean area with 4 enclosed alcohol pads. Let dry.  Apply patch as indicated in monitor instructions. Patch will be placed under  collarbone on left side of chest with arrow pointing upward.  Rub patch adhesive wings for 2 minutes. Remove white label marked "1". Remove the white label marked "2". Rub patch adhesive wings for 2 additional minutes.  While looking in a mirror, press and release button in center of patch. A small green light will flash 3-4 times. This will be your only indicator that the monitor has been turned on. ?  Do not shower for the first 24 hours. You may shower after the first 24 hours.  Press the button if you feel a symptom. You will hear a small click. Record Date, Time and Symptom in the Patient Logbook.  When you are ready to remove the patch, follow instructions on the last 2 pages of the Patient Logbook. Stick patch monitor onto the last page of Patient Logbook.  Place Patient Logbook in the blue and white box.  Use locking tab on box and tape box closed securely.  The blue and white box has prepaid postage on it. Please place it in the mailbox as soon as possible. Your physician should have your test results approximately 7 days after the monitor has been mailed back to [facility]  Call [facility] at [phone] if you have questions  regarding your ZIO XT patch monitor. Call them immediately if you see an orange light blinking on your monitor.  If your monitor falls off in less than 4 days, contact our Monitor department at [phone]. ?If your monitor becomes loose or falls off after 4 days call [employer] at [phone] for suggestions on securing your monitor.?   CTA chest/aorta due in 1 year-we will schedule this closer to due date  Follow-Up: At [facility], you and your health needs are our priority.  As part of our continuing mission to provide you with exceptional heart care, we have created designated Provider Care Teams.  These Care Teams include your primary Cardiologist (physician) and Advanced Practice Providers (APPs -  Physician Assistants and Nurse  Practitioners) who all work together to provide you with the care you need, when you need it.  We recommend signing up for the patient portal called "MyChart".  Sign up information is provided on this After Visit Summary.  MyChart is used to connect with patients for Virtual Visits (Telemedicine).  Patients are able to view lab/test results, encounter notes, upcoming appointments, etc.  Non-urgent messages can be sent to your provider as well.   To learn more about what you can do with MyChart, go to [email or web address].    Your next appointment:   6 month(s)  The format for your next appointment:   In Person  Provider:   Oswaldo Milian, MD

## 2021-02-17 ENCOUNTER — Telehealth: Payer: Self-pay | Admitting: Acute Care

## 2021-02-17 DIAGNOSIS — Z87891 Personal history of nicotine dependence: Secondary | ICD-10-CM

## 2021-02-17 NOTE — Telephone Encounter (Signed)
Pt informed of CT results per Eric Form, NP.  PT verbalized understanding.  Copy of CT sent to PCP.  Order placed for 1 yr f/u CT. Pt seen cardiology on 02/13/21 and is scheduled to have a Cardiac Scoring Ct on 03/06/21.

## 2021-02-19 DIAGNOSIS — R002 Palpitations: Secondary | ICD-10-CM | POA: Diagnosis not present

## 2021-02-24 DIAGNOSIS — H338 Other retinal detachments: Secondary | ICD-10-CM | POA: Diagnosis not present

## 2021-02-24 DIAGNOSIS — Z961 Presence of intraocular lens: Secondary | ICD-10-CM | POA: Diagnosis not present

## 2021-02-24 DIAGNOSIS — H3342 Traction detachment of retina, left eye: Secondary | ICD-10-CM | POA: Diagnosis not present

## 2021-02-25 NOTE — Progress Notes (Signed)
Please call patient and let them  know their  low dose Ct was read as a Lung RADS 1, negative study: no nodules or definitely benign nodules. Radiology recommendation is for a repeat LDCT in 12 months. .Please let them  know we will order and schedule their  annual screening scan for 01/2022. Please let them  know there was notation of CAD on their  scan.  Please remind the patient  that this is a non-gated exam therefore degree or severity of disease  cannot be determined. Please have them  follow up with their PCP regarding potential risk factor modification, dietary therapy or pharmacologic therapy if clinically indicated. Pt.  is not  currently on statin therapy. Please place order for annual  screening scan for  01/2022 and fax results to PCP. Thanks so much.  Pt. Is followed by cardiology, and has an echo.  Aortic atherosclerosis, in addition to 2 vessel coronary artery disease. 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 Attention at time of repeat annual low-dose lung cancer screening chest CT is recommended to ensure stability>>  Please make sure PCP is aware

## 2021-03-06 ENCOUNTER — Ambulatory Visit (INDEPENDENT_AMBULATORY_CARE_PROVIDER_SITE_OTHER)
Admission: RE | Admit: 2021-03-06 | Discharge: 2021-03-06 | Disposition: A | Discharge status: Home / Self Care | Payer: Self-pay | Source: Ambulatory Visit | Attending: Cardiology | Admitting: Cardiology

## 2021-03-06 ENCOUNTER — Ambulatory Visit (HOSPITAL_COMMUNITY): Payer: BC Managed Care – PPO | Attending: Cardiology

## 2021-03-06 ENCOUNTER — Other Ambulatory Visit: Payer: Self-pay

## 2021-03-06 DIAGNOSIS — R9431 Abnormal electrocardiogram [ECG] [EKG]: Secondary | ICD-10-CM | POA: Diagnosis not present

## 2021-03-06 DIAGNOSIS — R0602 Shortness of breath: Secondary | ICD-10-CM

## 2021-03-06 LAB — ECHOCARDIOGRAM COMPLETE
Area-P 1/2: 3.21 cm2
S' Lateral: 2.7 cm

## 2021-03-07 DIAGNOSIS — R002 Palpitations: Secondary | ICD-10-CM | POA: Diagnosis not present

## 2021-03-10 DIAGNOSIS — E538 Deficiency of other specified B group vitamins: Secondary | ICD-10-CM | POA: Diagnosis not present

## 2021-03-11 ENCOUNTER — Other Ambulatory Visit: Payer: Self-pay | Admitting: *Deleted

## 2021-03-11 MED ORDER — ROSUVASTATIN CALCIUM 10 MG PO TABS: 10.0000 mg | ORAL_TABLET | Freq: Every day | ORAL | 3 refills | Status: DC

## 2021-04-07 DIAGNOSIS — E538 Deficiency of other specified B group vitamins: Secondary | ICD-10-CM | POA: Diagnosis not present

## 2021-04-08 DIAGNOSIS — H109 Unspecified conjunctivitis: Secondary | ICD-10-CM | POA: Diagnosis not present

## 2021-04-18 DIAGNOSIS — H16012 Central corneal ulcer, left eye: Secondary | ICD-10-CM | POA: Diagnosis not present

## 2021-05-01 DIAGNOSIS — H16232 Neurotrophic keratoconjunctivitis, left eye: Secondary | ICD-10-CM | POA: Diagnosis not present

## 2021-05-01 DIAGNOSIS — H16012 Central corneal ulcer, left eye: Secondary | ICD-10-CM | POA: Diagnosis not present

## 2021-05-15 DIAGNOSIS — H16232 Neurotrophic keratoconjunctivitis, left eye: Secondary | ICD-10-CM | POA: Diagnosis not present

## 2021-05-15 DIAGNOSIS — H16012 Central corneal ulcer, left eye: Secondary | ICD-10-CM | POA: Diagnosis not present

## 2021-05-22 DIAGNOSIS — H16232 Neurotrophic keratoconjunctivitis, left eye: Secondary | ICD-10-CM | POA: Diagnosis not present

## 2021-05-22 DIAGNOSIS — H16012 Central corneal ulcer, left eye: Secondary | ICD-10-CM | POA: Diagnosis not present

## 2021-05-24 DIAGNOSIS — H338 Other retinal detachments: Secondary | ICD-10-CM | POA: Diagnosis not present

## 2021-05-31 DIAGNOSIS — H16232 Neurotrophic keratoconjunctivitis, left eye: Secondary | ICD-10-CM | POA: Diagnosis not present

## 2021-05-31 DIAGNOSIS — H33021 Retinal detachment with multiple breaks, right eye: Secondary | ICD-10-CM | POA: Diagnosis not present

## 2021-05-31 DIAGNOSIS — H16012 Central corneal ulcer, left eye: Secondary | ICD-10-CM | POA: Diagnosis not present

## 2021-06-02 DIAGNOSIS — Z79899 Other long term (current) drug therapy: Secondary | ICD-10-CM | POA: Diagnosis not present

## 2021-06-02 DIAGNOSIS — H547 Unspecified visual loss: Secondary | ICD-10-CM | POA: Diagnosis not present

## 2021-06-02 DIAGNOSIS — H33011 Retinal detachment with single break, right eye: Secondary | ICD-10-CM | POA: Diagnosis not present

## 2021-06-02 DIAGNOSIS — Z87891 Personal history of nicotine dependence: Secondary | ICD-10-CM | POA: Diagnosis not present

## 2021-06-02 DIAGNOSIS — I10 Essential (primary) hypertension: Secondary | ICD-10-CM | POA: Diagnosis not present

## 2021-06-12 DIAGNOSIS — H16012 Central corneal ulcer, left eye: Secondary | ICD-10-CM | POA: Diagnosis not present

## 2021-06-12 DIAGNOSIS — H16232 Neurotrophic keratoconjunctivitis, left eye: Secondary | ICD-10-CM | POA: Diagnosis not present

## 2021-06-18 DIAGNOSIS — H16232 Neurotrophic keratoconjunctivitis, left eye: Secondary | ICD-10-CM | POA: Diagnosis not present

## 2021-06-18 DIAGNOSIS — S0502XA Injury of conjunctiva and corneal abrasion without foreign body, left eye, initial encounter: Secondary | ICD-10-CM | POA: Diagnosis not present

## 2021-06-26 DIAGNOSIS — S0502XA Injury of conjunctiva and corneal abrasion without foreign body, left eye, initial encounter: Secondary | ICD-10-CM | POA: Diagnosis not present

## 2021-06-26 DIAGNOSIS — H16232 Neurotrophic keratoconjunctivitis, left eye: Secondary | ICD-10-CM | POA: Diagnosis not present

## 2021-07-11 DIAGNOSIS — H16232 Neurotrophic keratoconjunctivitis, left eye: Secondary | ICD-10-CM | POA: Diagnosis not present

## 2021-07-11 DIAGNOSIS — S0502XA Injury of conjunctiva and corneal abrasion without foreign body, left eye, initial encounter: Secondary | ICD-10-CM | POA: Diagnosis not present

## 2021-07-17 DIAGNOSIS — Z9841 Cataract extraction status, right eye: Secondary | ICD-10-CM | POA: Diagnosis not present

## 2021-07-17 DIAGNOSIS — H3342 Traction detachment of retina, left eye: Secondary | ICD-10-CM | POA: Diagnosis not present

## 2021-07-17 DIAGNOSIS — I10 Essential (primary) hypertension: Secondary | ICD-10-CM | POA: Diagnosis not present

## 2021-07-17 DIAGNOSIS — Z79899 Other long term (current) drug therapy: Secondary | ICD-10-CM | POA: Diagnosis not present

## 2021-07-17 DIAGNOSIS — Z9842 Cataract extraction status, left eye: Secondary | ICD-10-CM | POA: Diagnosis not present

## 2021-07-17 DIAGNOSIS — H409 Unspecified glaucoma: Secondary | ICD-10-CM | POA: Diagnosis not present

## 2021-07-17 DIAGNOSIS — Z961 Presence of intraocular lens: Secondary | ICD-10-CM | POA: Diagnosis not present

## 2021-07-17 DIAGNOSIS — Z48810 Encounter for surgical aftercare following surgery on the sense organs: Secondary | ICD-10-CM | POA: Diagnosis not present

## 2021-07-17 DIAGNOSIS — Z87891 Personal history of nicotine dependence: Secondary | ICD-10-CM | POA: Diagnosis not present

## 2021-09-11 IMAGING — CT CT CHEST LUNG CANCER SCREENING LOW DOSE W/O CM
2 of 5 series · 15 of 40 positions shown, 18 images · non-contrast
Comparison: No priors.

CLINICAL DATA: 60-year-old male former smoker (quit in 1806) with
35 pack-year history of smoking. Lung cancer screening examination.

EXAM:
CT CHEST WITHOUT CONTRAST LOW-DOSE FOR LUNG CANCER SCREENING
TECHNIQUE: Multidetector CT imaging of the chest was performed following the
standard protocol without IV contrast.

[Series 4: lung 1.00 br44 cor · coronal · 0.73mm/px · 3 of 283 slices shown]
[im 57/283  lung]
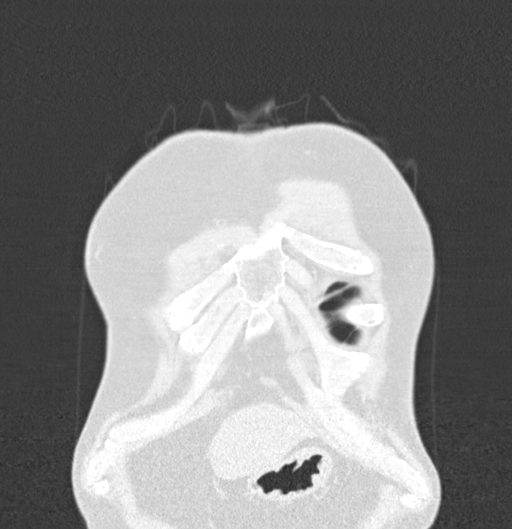
[im 113/283  lung]
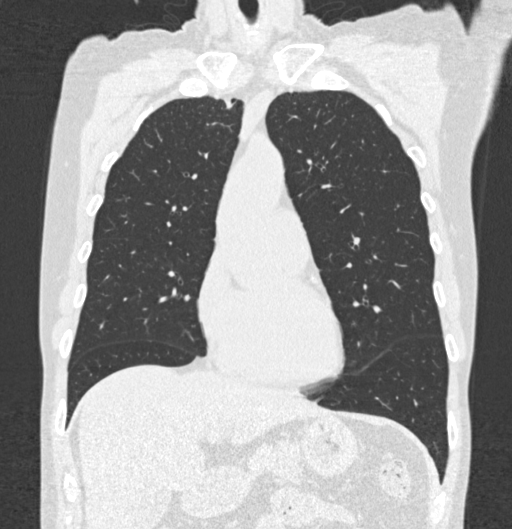
[im 170/283  lung]
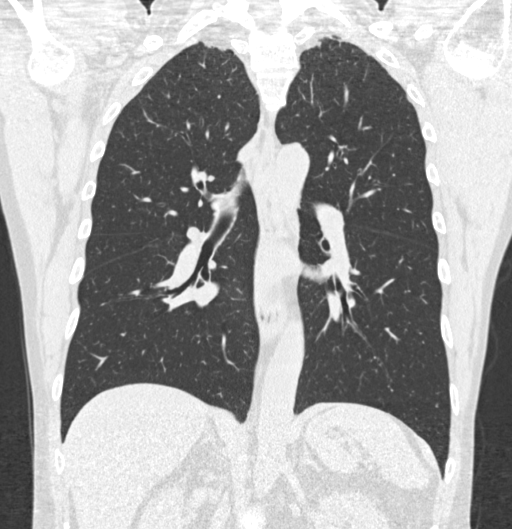

[Series 9: lung 1.00 br60 axial · axial · 0.73mm/px · z∈[-1184,-834]mm · 12 of 386 slices shown, 15 images]
[im 18/386  mediastinal]
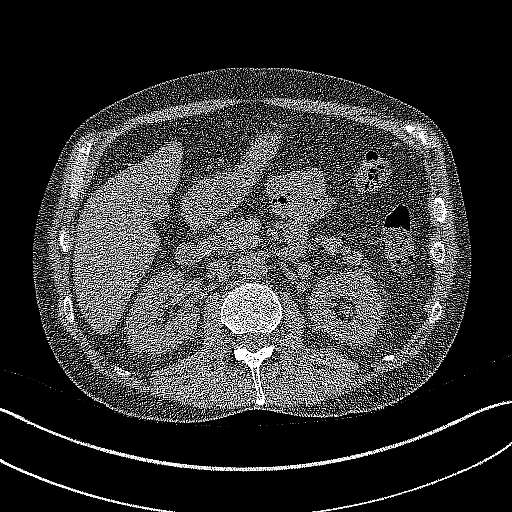
[im 18/386  lung]
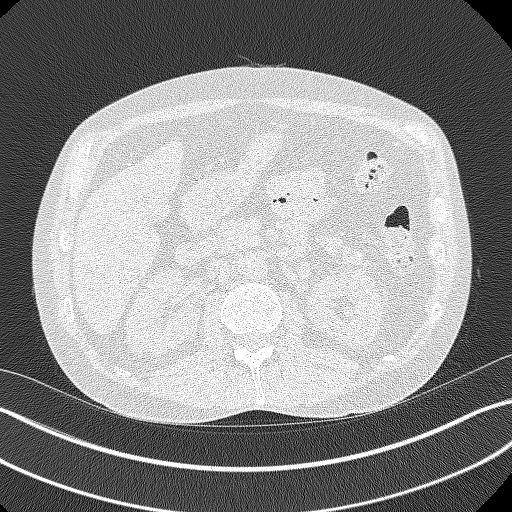
[im 53/386  lung]
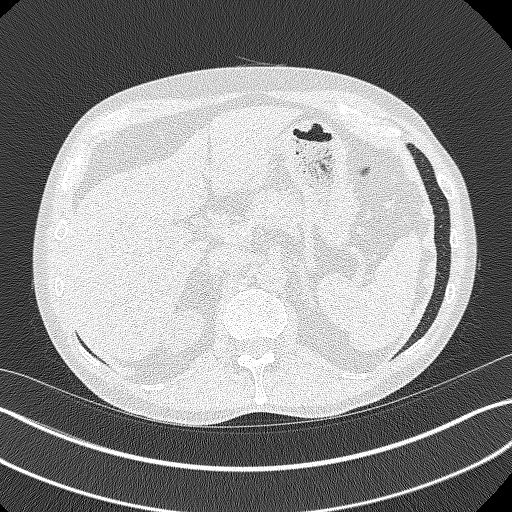
[im 88/386  lung]
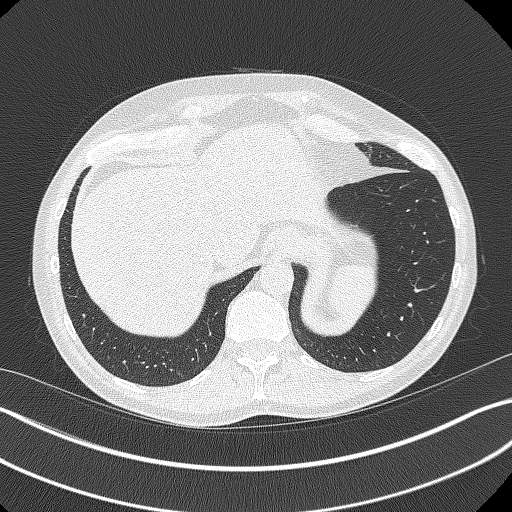
[im 123/386  lung]
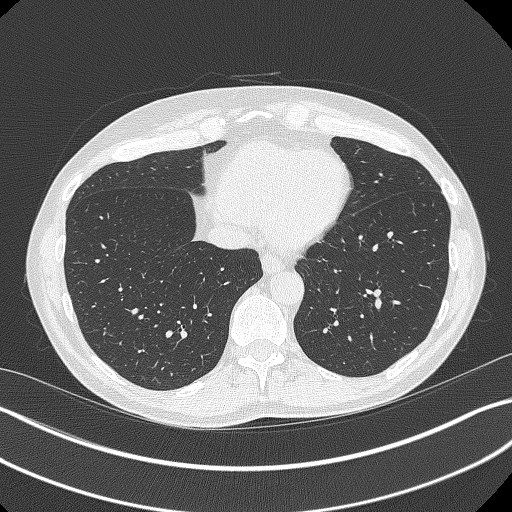
[im 141/386  mediastinal]
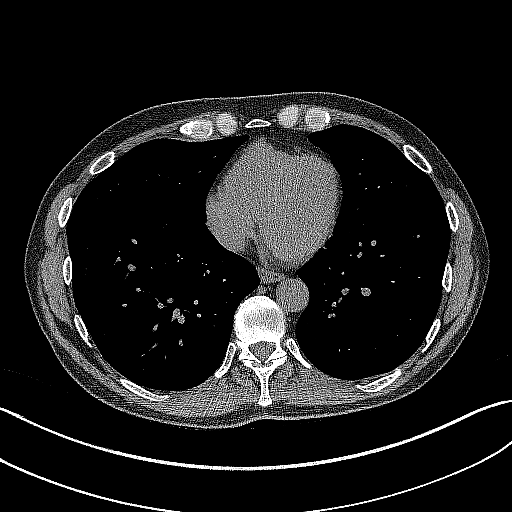
[im 141/386  lung]
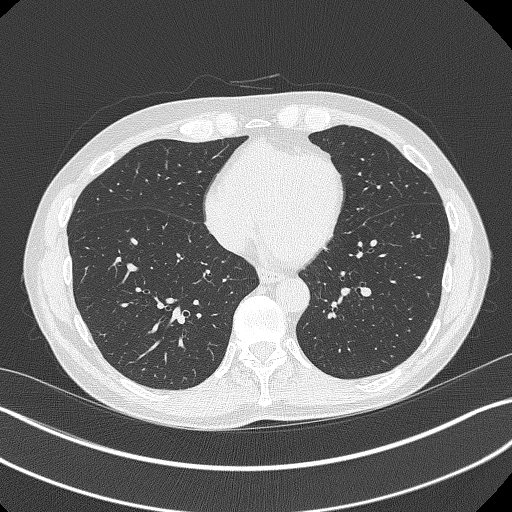
[im 176/386  lung]
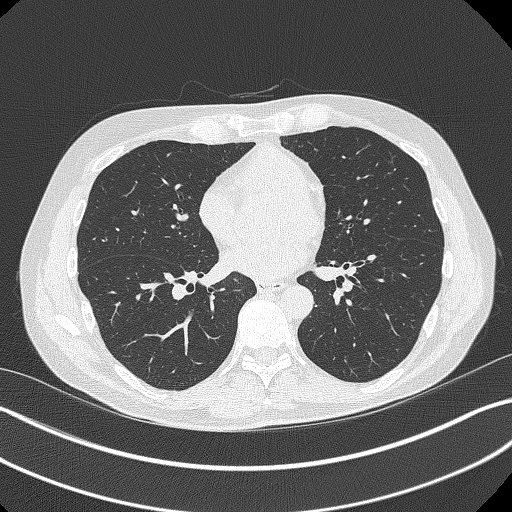
[im 211/386  lung]
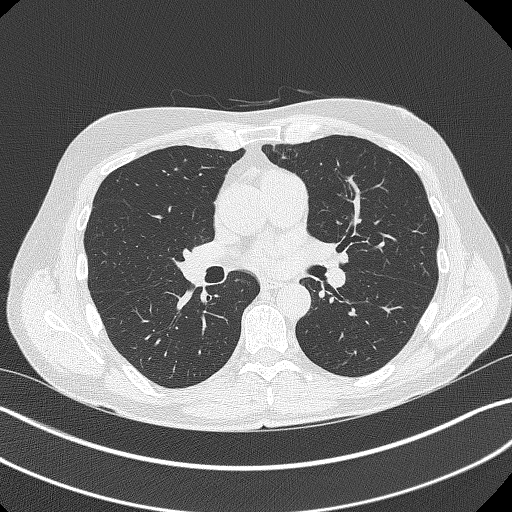
[im 246/386  lung]
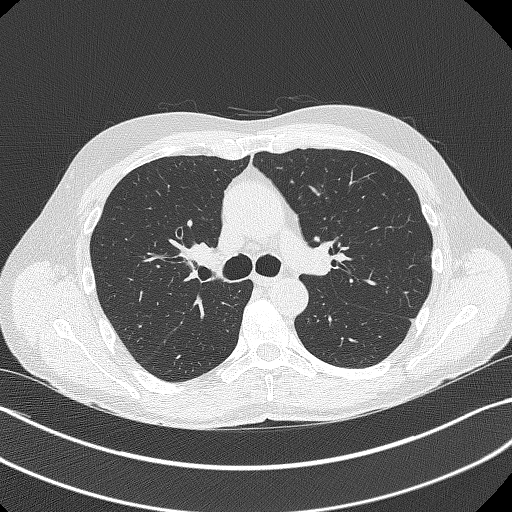
[im 263/386  mediastinal]
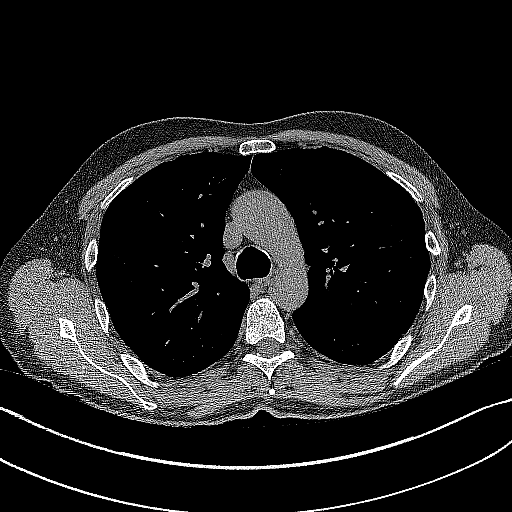
[im 263/386  lung]
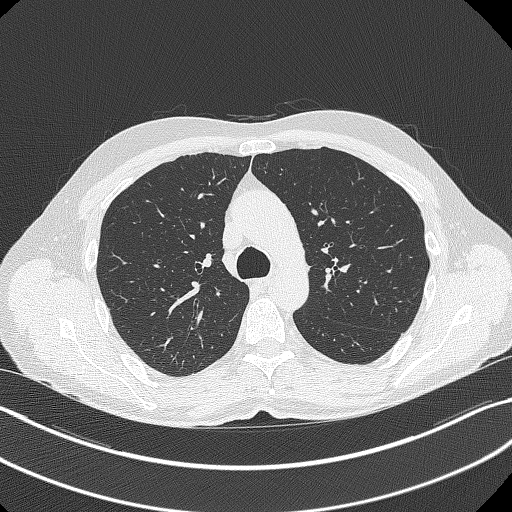
[im 298/386  lung]
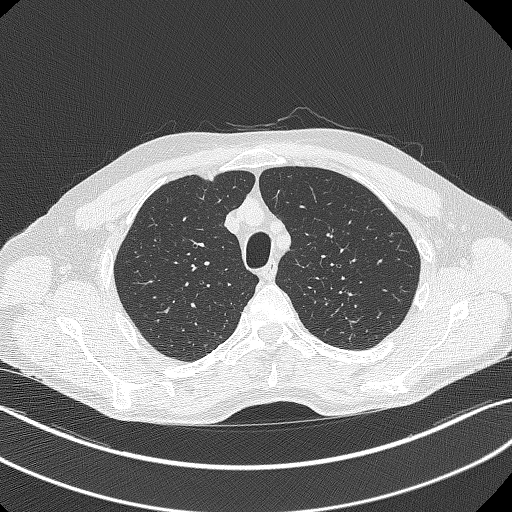
[im 333/386  lung]
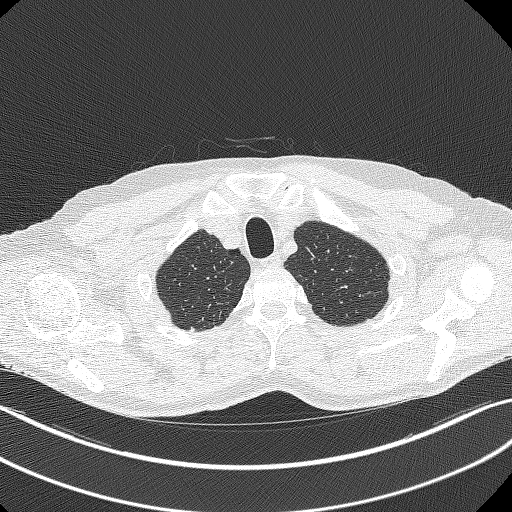
[im 368/386  lung]
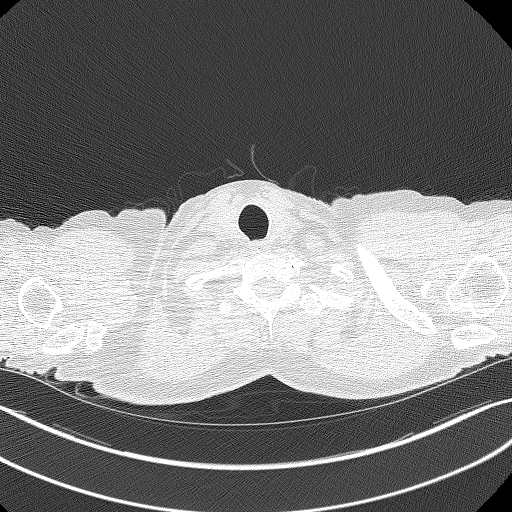

[15 of 40 positions shown; findings below may reference images not displayed]

FINDINGS: Cardiovascular: Heart size is normal. There is no significant
pericardial fluid, thickening or pericardial calcification. There is
aortic atherosclerosis, as well as atherosclerosis of the great
vessels of the mediastinum and the coronary arteries, including
calcified atherosclerotic plaque in the left anterior descending and
left circumflex coronary arteries. Ectasia of ascending thoracic
aorta (4.0 cm in diameter).

Mediastinum/Nodes: No pathologically enlarged mediastinal or hilar
lymph nodes. Esophagus is unremarkable in appearance. No axillary
lymphadenopathy.

Lungs/Pleura: Tiny calcified granuloma in the right middle lobe. No
other suspicious appearing pulmonary nodules or masses are noted. No
acute consolidative airspace disease. No pleural effusions. Diffuse
bronchial wall thickening with very mild centrilobular and mild
paraseptal emphysema.

Upper Abdomen: Aortic atherosclerosis.

Musculoskeletal: There are no aggressive appearing lytic or blastic
lesions noted in the visualized portions of the skeleton.
IMPRESSION: 1. Lung-RADS 1S, negative. Continue annual screening with low-dose
chest CT without contrast in 12 months.
2. The "S" modifier above refers to potentially clinically
significant non lung cancer related findings. Specifically, there is
aortic atherosclerosis, in addition to 2 vessel coronary artery
disease. Please note that although the presence of coronary artery
calcium documents the presence of coronary artery disease, the
severity of this disease and any potential stenosis cannot be
assessed on this non-gated CT examination. Assessment for potential
risk factor modification, dietary therapy or pharmacologic therapy
may be warranted, if clinically indicated.
3. Mild diffuse bronchial wall thickening with very mild
centrilobular and mild paraseptal emphysema; imaging findings
suggestive of underlying COPD.
4. Ectasia of ascending thoracic aorta (4.0 cm in diameter).
Attention at time of repeat annual low-dose lung cancer screening
chest CT is recommended to ensure stability.

Aortic Atherosclerosis (RGJ7C-PAD.D) and Emphysema (RGJ7C-ERV.G).

## 2021-10-07 ENCOUNTER — Telehealth: Payer: Self-pay | Admitting: Cardiology

## 2021-10-07 NOTE — Telephone Encounter (Signed)
Lizeth from [employer] on behalf of the patient. She states he wants to have his CT at [facility] and requests the order be faxed to them at: [phone]. She says if there are any questions to call: [phone] option 2, then option 3 ? ? ? ?

## 2021-10-08 NOTE — Telephone Encounter (Signed)
That is fine to have done at [facility].  It looks like his CTA is scheduled for later this month but he needs it 1 year from his calcium score, so would not need to be done until August ?

## 2021-10-08 NOTE — Telephone Encounter (Signed)
Left message for patient to call back ? ?CTA scheduled 3/20-not due until August.  ?

## 2021-10-13 ENCOUNTER — Other Ambulatory Visit: Payer: BC Managed Care – PPO

## 2021-11-20 DIAGNOSIS — H26492 Other secondary cataract, left eye: Secondary | ICD-10-CM | POA: Diagnosis not present

## 2021-11-20 DIAGNOSIS — H1789 Other corneal scars and opacities: Secondary | ICD-10-CM | POA: Diagnosis not present

## 2022-01-06 DIAGNOSIS — E538 Deficiency of other specified B group vitamins: Secondary | ICD-10-CM | POA: Diagnosis not present

## 2022-01-06 DIAGNOSIS — E559 Vitamin D deficiency, unspecified: Secondary | ICD-10-CM | POA: Diagnosis not present

## 2022-01-06 DIAGNOSIS — Z125 Encounter for screening for malignant neoplasm of prostate: Secondary | ICD-10-CM | POA: Diagnosis not present

## 2022-01-06 DIAGNOSIS — Z13220 Encounter for screening for lipoid disorders: Secondary | ICD-10-CM | POA: Diagnosis not present

## 2022-01-06 DIAGNOSIS — Z0000 Encounter for general adult medical examination without abnormal findings: Secondary | ICD-10-CM | POA: Diagnosis not present

## 2022-01-09 DIAGNOSIS — L719 Rosacea, unspecified: Secondary | ICD-10-CM | POA: Diagnosis not present

## 2022-01-09 DIAGNOSIS — E559 Vitamin D deficiency, unspecified: Secondary | ICD-10-CM | POA: Diagnosis not present

## 2022-01-09 DIAGNOSIS — I10 Essential (primary) hypertension: Secondary | ICD-10-CM | POA: Diagnosis not present

## 2022-01-09 DIAGNOSIS — E538 Deficiency of other specified B group vitamins: Secondary | ICD-10-CM | POA: Diagnosis not present

## 2022-01-09 DIAGNOSIS — Z0000 Encounter for general adult medical examination without abnormal findings: Secondary | ICD-10-CM | POA: Diagnosis not present

## 2022-02-05 ENCOUNTER — Other Ambulatory Visit: Payer: BC Managed Care – PPO

## 2022-02-21 DIAGNOSIS — L03012 Cellulitis of left finger: Secondary | ICD-10-CM | POA: Diagnosis not present

## 2022-02-24 ENCOUNTER — Other Ambulatory Visit: Payer: Self-pay

## 2022-02-24 ENCOUNTER — Emergency Department (HOSPITAL_COMMUNITY): Payer: BC Managed Care – PPO

## 2022-02-24 ENCOUNTER — Emergency Department (HOSPITAL_COMMUNITY)
Admission: EM | Admit: 2022-02-24 | Discharge: 2022-02-24 | Disposition: A | Discharge status: Home / Self Care | Payer: BC Managed Care – PPO | Attending: Emergency Medicine | Admitting: Emergency Medicine

## 2022-02-24 DIAGNOSIS — I10 Essential (primary) hypertension: Secondary | ICD-10-CM | POA: Diagnosis not present

## 2022-02-24 DIAGNOSIS — R55 Syncope and collapse: Secondary | ICD-10-CM | POA: Diagnosis not present

## 2022-02-24 DIAGNOSIS — E860 Dehydration: Secondary | ICD-10-CM | POA: Insufficient documentation

## 2022-02-24 DIAGNOSIS — I213 ST elevation (STEMI) myocardial infarction of unspecified site: Secondary | ICD-10-CM | POA: Diagnosis not present

## 2022-02-24 DIAGNOSIS — R197 Diarrhea, unspecified: Secondary | ICD-10-CM | POA: Diagnosis not present

## 2022-02-24 DIAGNOSIS — R0902 Hypoxemia: Secondary | ICD-10-CM | POA: Diagnosis not present

## 2022-02-24 DIAGNOSIS — R531 Weakness: Secondary | ICD-10-CM | POA: Diagnosis not present

## 2022-02-24 LAB — CBC
HCT: 41.9 % (ref 39.0–52.0)
Hemoglobin: 14.4 g/dL (ref 13.0–17.0)
MCH: 32.3 pg (ref 26.0–34.0)
MCHC: 34.4 g/dL (ref 30.0–36.0)
MCV: 93.9 fL (ref 80.0–100.0)
Platelets: 203 10*3/uL (ref 150–400)
RBC: 4.46 MIL/uL (ref 4.22–5.81)
RDW: 12.2 % (ref 11.5–15.5)
WBC: 8.4 10*3/uL (ref 4.0–10.5)
nRBC: 0 % (ref 0.0–0.2)

## 2022-02-24 LAB — BASIC METABOLIC PANEL
Anion gap: 9 (ref 5–15)
BUN: 13 mg/dL (ref 6–20)
CO2: 19 mmol/L — ABNORMAL LOW (ref 22–32)
Calcium: 8.6 mg/dL — ABNORMAL LOW (ref 8.9–10.3)
Chloride: 103 mmol/L (ref 98–111)
Creatinine, Ser: 1.44 mg/dL — ABNORMAL HIGH (ref 0.61–1.24)
GFR, Estimated: 56 mL/min — ABNORMAL LOW (ref >60)
Glucose, Bld: 153 mg/dL — ABNORMAL HIGH (ref 70–99)
Potassium: 4.1 mmol/L (ref 3.5–5.1)
Sodium: 131 mmol/L — ABNORMAL LOW (ref 135–145)

## 2022-02-24 LAB — TROPONIN I (HIGH SENSITIVITY)
Troponin I (High Sensitivity): 7 ng/L (ref <18)
Troponin I (High Sensitivity): 6 ng/L (ref <18)

## 2022-02-24 MED ORDER — LACTATED RINGERS IV BOLUS
1000.0000 mL | Freq: Once | INTRAVENOUS | Status: AC
  Administered 2022-02-24: 1000 mL via INTRAVENOUS

## 2022-02-24 MED ORDER — SODIUM CHLORIDE 0.9 % IV BOLUS
1000.0000 mL | Freq: Once | INTRAVENOUS | Status: AC
  Administered 2022-02-24: 1000 mL via INTRAVENOUS

## 2022-02-24 NOTE — ED Notes (Signed)
Pt transported to xray 

## 2022-02-24 NOTE — ED Triage Notes (Signed)
Pt BIB EMS from work complaining of sudden onset weakness while at work. Pt became diaphoretic, felt like he was going to pass out and had one episode of loss of bowel. He was activated as a STEMI based on the EKG. Cancelled upon arrival. The patient received 324 of ASA and 1 sublingual nitro en route.  EMS vitals 160/110 HR 90 97% RA

## 2022-02-24 NOTE — ED Notes (Signed)
Pt verbalized understanding of d/c instructions, meds, and followup care. Denies questions. VSS, no distress noted. Steady gait to exit with all belongings.  ?

## 2022-02-24 NOTE — Discharge Instructions (Signed)
You were seen in the emergency department for your near syncopal event.  Your blood pressure was low when you first arrived likely due to the nitroglycerin medication that you were given by medics.  We also got labs that showed signs of dehydration which could be related to the recent diarrhea that you are having.  There is no signs of any abnormal heart rhythms or stress on your heart causing your symptoms.  We gave you fluids through the IV in the emergency department with improvement of your blood pressure and your symptoms.  You should follow-up with your primary doctor within the next week to have your labs rechecked and your symptoms rechecked.  You should return to the emergency department if you have recurrent lightheadedness or you pass out, you get chest pain or shortness of breath, you have numbness or weakness on one side of your body compared to the other or if you have any other new or concerning symptoms.

## 2022-02-24 NOTE — ED Provider Notes (Signed)
[facility] EMERGENCY DEPARTMENT Provider Note   CSN: [id number] Arrival date & time: 02/24/22  0944     History  Chief Complaint  Patient presents with   Weakness    Thomas Kennedy is a 61 y.o. male.  Patient is a 61 year old male with a past medical history of hypertension presenting to the emergency department with a near syncopal episode.  The patient states that he was at work this morning when he started to feel lightheaded, broke out into a sweat and nearly passed out.  He denied any associated chest pain or shortness of breath, nausea or vomiting.  He did have 1 episode of diarrhea this morning.  He denies any lower extremity swelling.  He denies any pain in his back or abdomen.  He denies any numbness or weakness.  He states that he was started on antibiotics this weekend for a finger infection but otherwise has had no changes.  He denies any known cardiac history.  He states that he has been eating and drinking normally.  Prior to arrival via EMS, the patient was given 1 dose of nitro and aspirin in route.  Initial blood pressure for medics was in the 427C systolic.  The history is provided by the patient and the EMS personnel.  Weakness      Home Medications Prior to Admission medications   Medication Sig Start Date End Date Taking? Authorizing Provider  aspirin 325 MG tablet Take 325 mg by mouth every 6 (six) hours as needed for mild pain, moderate pain or headache.   Yes [provider]  cephALEXin (KEFLEX) 500 MG capsule Take 500 mg by mouth 2 (two) times daily. 02/21/22  Yes [provider]  Cyanocobalamin (VITAMIN B-12 PO) Take 1 tablet by mouth daily.   Yes [provider]  irbesartan (AVAPRO) 150 MG tablet Take 150 mg by mouth daily. 08/03/16  Yes [provider]  mupirocin ointment (BACTROBAN) 2 % Apply 1 Application topically 3 (three) times daily. 02/21/22  Yes [provider]  rosuvastatin (CRESTOR) 10  MG tablet Take 1 tablet (10 mg total) by mouth daily. 03/11/21 05/02/22 Yes Donato Heinz, MD      Allergies    Ace inhibitors    Review of Systems   Review of Systems  Neurological:  Positive for weakness.    Physical Exam Updated Vital Signs BP 110/76   Pulse 79   Temp 98.9 F (37.2 C)   Resp 18   Ht '5\' 10"'$  (1.778 m)   Wt 73.5 kg   SpO2 96%   BMI 23.24 kg/m  Physical Exam Vitals and nursing note reviewed.  Constitutional:      General: He is not in acute distress.    Appearance: Normal appearance.  HENT:     Head: Normocephalic and atraumatic.     Mouth/Throat:     Mouth: Mucous membranes are moist.  Eyes:     Extraocular Movements: Extraocular movements intact.     Conjunctiva/sclera: Conjunctivae normal.     Pupils: Pupils are equal, round, and reactive to light.  Cardiovascular:     Rate and Rhythm: Normal rate and regular rhythm.     Pulses: Normal pulses.  Pulmonary:     Effort: Pulmonary effort is normal.     Breath sounds: Normal breath sounds.  Abdominal:     General: Abdomen is flat.     Palpations: Abdomen is soft.  Musculoskeletal:        General:  Normal range of motion.     Cervical back: Normal range of motion and neck supple.  Skin:    General: Skin is warm and dry.  Neurological:     General: No focal deficit present.     Mental Status: He is alert and oriented to person, place, and time.     Sensory: No sensory deficit.     Motor: No weakness.  Psychiatric:        Mood and Affect: Mood normal.        Behavior: Behavior normal.     ED Results / Procedures / Treatments   Labs (all labs ordered are listed, but only abnormal results are displayed) Labs Reviewed  BASIC METABOLIC PANEL - Abnormal; Notable for the following components:      Result Value   Sodium 131 (*)    CO2 19 (*)    Glucose, Bld 153 (*)    Creatinine, Ser 1.44 (*)    Calcium 8.6 (*)    GFR, Estimated 56 (*)    All other components within normal limits   CBC  TROPONIN I (HIGH SENSITIVITY)  TROPONIN I (HIGH SENSITIVITY)    EKG EKG Interpretation  Date/Time:  Tuesday February 24 2022 10:03:13 EDT Ventricular Rate:  86 PR Interval:  185 QRS Duration: 94 QT Interval:  377 QTC Calculation: 451 R Axis:   56 Text Interpretation: Sinus rhythm Probable anteroseptal infarct, old ST elevation in V1-V2 without reciprocal depression No previous ECGs available Confirmed by Oneal Deputy [id number] on 02/24/2022 11:47:16 AM  Radiology DG Chest 2 View  Result Date: 02/24/2022 CLINICAL DATA:  Presyncope. EXAM: CHEST - 2 VIEW COMPARISON:  October 18, 2019. FINDINGS: The heart size and mediastinal contours are within normal limits. Both lungs are clear. No visible pleural effusions or pneumothorax. Biapical pleuroparenchymal scarring. No acute osseous abnormality. IMPRESSION: No active cardiopulmonary disease. Electronically Signed   By: Margaretha Sheffield M.D.   On: 02/24/2022 10:36    Procedures .Critical Care  Performed by: Ottie Glazier, DO Authorized by: Ottie Glazier, DO   Critical care provider statement:    Critical care time (minutes):  40   Critical care time was exclusive of:  Separately billable procedures and treating other patients   Critical care was necessary to treat or prevent imminent or life-threatening deterioration of the following conditions:  Dehydration and shock   Critical care was time spent personally by me on the following activities:  Ordering and performing treatments and interventions, development of treatment plan with patient or surrogate, ordering and review of laboratory studies, ordering and review of radiographic studies, re-evaluation of patient's condition, evaluation of patient's response to treatment and examination of patient   I assumed direction of critical care for this patient from another provider in my specialty: no       Medications Ordered in ED Medications  sodium chloride 0.9 % bolus  1,000 mL (0 mLs Intravenous Stopped 02/24/22 1207)  lactated ringers bolus 1,000 mL (1,000 mLs Intravenous New Bag/Given 02/24/22 1208)    ED Course/ Medical Decision Making/ A&P Clinical Course as of 02/24/22 1535  Tue Feb 24, 2022  1148 Labs reviewed and interpreted by myself show signs of dehydration with a bicarb of 19 and a sodium of 131.  Creatinine is 1.4 without known baseline.  His blood pressures remain soft in the 60F systolic.  He will be given a second liter of IV fluids and be reassessed. [VK]  1148 Initial troponin is negative, EKG  had ST elevations in V1 and V2 without prior EKGs for comparison.  Repeat EKG will be performed and he will have second troponin performed. [VK]  1232 Upon reassessment, the patient reports significant improvement of his symptoms.  His blood pressure is now in the 631 systolic.  The patient does report that he had 4 episodes of diarrhea yesterday and one episode this morning but otherwise states that he has been eating and drinking normally.  He states he has not been excessively in the heat. [VK]  1525 Repeat troponin negative [VK]  1528 Upon reassessment, the patient states that he feels back to his baseline and no longer has any dizziness.  His blood pressure has stabilized.  He states he has a primary care doctor that he will be able to follow-up with for recheck of his labs next week and for reassessment of his symptoms.  He was recommended to drink plenty of fluids over the next week.  He was given strict return precautions. [VK]    Clinical Course User Index [VK] Ottie Glazier, DO                           Medical Decision Making Patient is a 61 year old male with a past medical history of hypertension presenting to the emergency department with a near syncopal episode.  The patient was given a dose of nitroglycerin in route and he reported no changes in his symptoms.  He was hypotensive on arrival to the 49F systolic which may be secondary to the  nitroglycerin.  IV access was obtained and he was given 1 L of IV fluids with improvement of his blood pressure to the 02O to 37C systolic.  EKG performed here showed mild ST elevation in V1 and V2 similar to his prearrival EKG.  He has no reciprocal depressions, making STEMI unlikely however he will have cardiac work-up to further evaluate for ACS.  No obvious arrhythmia on EKG as cause of his near syncopal episode but will be monitored on the cardiac monitor.  He will additionally have labs performed to evaluate for anemia, dehydration or electrolyte abnormality as a cause of his symptoms.  He has equal pulses in all 4 extremities, equal blood pressures bilaterally and no focal neurologic deficits and no pain making aortic dissection unlikely.  Amount and/or Complexity of Data Reviewed Labs: ordered. Decision-making details documented in ED Course. Radiology: ordered. Decision-making details documented in ED Course. ECG/medicine tests: ordered. Decision-making details documented in ED Course.           Final Clinical Impression(s) / ED Diagnoses Final diagnoses:  Dehydration  Near syncope    Rx / DC Orders ED Discharge Orders     None         Ottie Glazier, DO 02/24/22 1536

## 2022-03-05 ENCOUNTER — Other Ambulatory Visit: Payer: Self-pay

## 2022-03-05 DIAGNOSIS — Z87891 Personal history of nicotine dependence: Secondary | ICD-10-CM

## 2022-03-05 DIAGNOSIS — Z122 Encounter for screening for malignant neoplasm of respiratory organs: Secondary | ICD-10-CM

## 2022-03-10 ENCOUNTER — Other Ambulatory Visit: Payer: Self-pay | Admitting: Cardiology

## 2022-03-11 NOTE — Progress Notes (Unsigned)
Cardiology Clinic Note   Patient Name: Thomas Kennedy [id number] Date of Encounter: 03/12/2022  Primary Care Provider:  Lawerance Cruel, MD Primary Cardiologist:  Donato Heinz, MD  Patient Profile     Thomas Kennedy 61 year old male presents to the clinic today for follow-up evaluation of his palpitations and shortness of breath.  Past Medical History    No past medical history on file. No past surgical history on file.  Allergies  Allergies  Allergen Reactions   Ace Inhibitors Cough    History of Present Illness     Thomas Kennedy is a PMH of aortic dilation, hyperlipidemia, palpitations, shortness of breath, and abnormal EKG.  He was referred by Dr. Harrington Challenger for evaluation of his coronary calcifications on his chest CT.  He was taking irbesartan 150 mg and reported well-controlled blood pressure in the 130s over 80s.  He did note occasional shortness of breath hot flashes and palpitations that would come on once a week and last about 5 to 10 minutes.  He felt his heart race with these episodes.  He also noted occasional lightheadedness.  He reported presyncopal episodes when he went to the grocery store.  He denied lower extremity swelling or chest pain.  He previously was exercising 30-40 minutes with no complications however he had stopped over the previous 5 months.  He reported family history of heart disease.  A cardiac CT was ordered and showed a calcium score of 51 which placed him in the 63rd percentile for age, race, and sex matched controls.  He was noted to have 41 mm dilated ascending aorta.  Atherosclerosis was noted in his aortic arch.  His echocardiogram 03/06/2020 showed normal EF and mild LVH, G1 DD with mild mitral valve regurgitation.  A cardiac event monitor was ordered.  He wore the monitor for 13 days and was noted to have predominantly sinus rhythm.  He was noted to have 16 episodes of SVT with the fastest heart rate being 197 which lasted for 10 beats.  The  he was also noted to have some episodes of supraventricular tachycardia, atrial tachycardia, and isolated SVE's.  He was started on rosuvastatin 10 mg daily.  He presents to the clinic today for follow-up evaluation states he had a recent visit to the emergency department.  He was found to have ST elevation on his EKG.  He reported having a upper respiratory infection and diarrhea.  His cardiac enzyme levels were negative and his EKG was reviewed by cardiology and not felt to have ischemic changes.  We reviewed his coronary CTA and cardiac event monitor.  He expressed understanding.  He is tolerating rosuvastatin well.  We discussed plans for preventing future plaque accumulation.  We will repeat his fasting lipids and LFTs in 6-8 weeks, have him increase physical activity as tolerated, and plan follow-up in 6 months.  Today he denies chest pain, shortness of breath, lower extremity edema, fatigue, palpitations, melena, hematuria, hemoptysis, diaphoresis, weakness, presyncope, syncope, orthopnea, and PND.     Home Medications    Prior to Admission medications   Medication Sig Start Date End Date Taking? Authorizing Provider  aspirin 325 MG tablet Take 325 mg by mouth every 6 (six) hours as needed for mild pain, moderate pain or headache.    [provider]  cephALEXin (KEFLEX) 500 MG capsule Take 500 mg by mouth 2 (two) times daily. 02/21/22   [provider]  Cyanocobalamin (VITAMIN B-12 PO) Take 1 tablet by  mouth daily.    [provider]  irbesartan (AVAPRO) 150 MG tablet Take 150 mg by mouth daily. 08/03/16   [provider]  mupirocin ointment (BACTROBAN) 2 % Apply 1 Application topically 3 (three) times daily. 02/21/22   [provider]  rosuvastatin (CRESTOR) 10 MG tablet TAKE 1 TABLET BY MOUTH EVERY DAY 03/10/22   Donato Heinz, MD    Family History    No family history on file. has no family status information on file.   Social  History    Social History   Socioeconomic History   Marital status: Single    Spouse name: Not on file   Number of children: Not on file   Years of education: Not on file   Highest education level: Not on file  Occupational History   Not on file  Tobacco Use   Smoking status: Former    Types: Cigarettes    Quit date: 2014    Years since quitting: 9.6   Smokeless tobacco: Former  Substance and Sexual Activity   Alcohol use: Not on file   Drug use: Not on file   Sexual activity: Not on file  Other Topics Concern   Not on file  Social History Narrative   Not on file   Social Determinants of Health   Financial Resource Strain: Not on file  Food Insecurity: Not on file  Transportation Needs: Not on file  Physical Activity: Not on file  Stress: Not on file  Social Connections: Not on file  Intimate Partner Violence: Not on file     Review of Systems    General:  No chills, fever, night sweats or weight changes.  Cardiovascular:  No chest pain, dyspnea on exertion, edema, orthopnea, palpitations, paroxysmal nocturnal dyspnea. Dermatological: No rash, lesions/masses Respiratory: No cough, dyspnea Urologic: No hematuria, dysuria Abdominal:   No nausea, vomiting, diarrhea, bright red blood per rectum, melena, or hematemesis Neurologic:  No visual changes, wkns, changes in mental status. All other systems reviewed and are otherwise negative except as noted above.  Physical Exam    VS:  BP 116/80   Pulse 79   Ht '5\' 10"'$  (1.778 m)   Wt 166 lb 6.4 oz (75.5 kg)   BMI 23.88 kg/m  , BMI Body mass index is 23.88 kg/m. GEN: Well nourished, well developed, in no acute distress. HEENT: normal. Neck: Supple, no JVD, carotid bruits, or masses. Cardiac: RRR, no murmurs, rubs, or gallops. No clubbing, cyanosis, edema.  Radials/DP/PT 2+ and equal bilaterally.  Respiratory:  Respirations regular and unlabored, clear to auscultation bilaterally. GI: Soft, nontender, nondistended, BS  + x 4. MS: no deformity or atrophy. Skin: warm and dry, no rash. Neuro:  Strength and sensation are intact. Psych: Normal affect.  Accessory Clinical Findings    Recent Labs: 02/24/2022: BUN 13; Creatinine, Ser 1.44; Hemoglobin 14.4; Platelets 203; Potassium 4.1; Sodium 131   Recent Lipid Panel No results found for: "CHOL", "TRIG", "HDL", "CHOLHDL", "VLDL", "LDLCALC", "LDLDIRECT"  ECG personally reviewed by me today-none today.  Echocardiogram 02/1121 IMPRESSIONS     1. Left ventricular ejection fraction, by estimation, is 55 to 60%. The  left ventricle has normal function. The left ventricle has no regional  wall motion abnormalities. There is mild left ventricular hypertrophy of  the basal-septal segment. Left  ventricular diastolic parameters are consistent with Grade I diastolic  dysfunction (impaired relaxation). The average left ventricular global  longitudinal strain is -19.7 %. The global longitudinal strain is  normal.   2. Right ventricular systolic function is normal. The right ventricular  size is normal.   3. The mitral valve is normal in structure. Mild mitral valve  regurgitation. No evidence of mitral stenosis.   4. The aortic valve is tricuspid. Aortic valve regurgitation is not  visualized. Mild aortic valve sclerosis is present, with no evidence of  aortic valve stenosis.   5. Aortic dilatation noted. There is mild dilatation of the aortic root,  measuring 40 mm.   6. The inferior vena cava is normal in size with greater than 50%  respiratory variability, suggesting right atrial pressure of 3 mmHg.  CT cardiac scoring 03/06/2021 FINDINGS: Coronary arteries: Normal origins.   Coronary Calcium Score:   Left main: 0   Left anterior descending artery: 13   Left circumflex artery: 38   Right coronary artery: 0   Total: 51   Percentile: 63   Pericardium: Normal.   Ascending Aorta: Normal caliber.   Non-cardiac: See separate report from [facility].   IMPRESSION: Coronary calcium score of 51. This was 41 percentile for age-, race-, and sex-matched controls.   2.  Dilated ascending aorta, 41 mm.   3.  Aortic arch atherosclerosis.   RECOMMENDATIONS: Coronary artery calcium (CAC) score is a strong predictor of incident coronary heart disease (CHD) and provides predictive information beyond traditional risk factors. CAC scoring is reasonable to use in the decision to withhold, postpone, or initiate statin therapy in intermediate-risk or selected borderline-risk asymptomatic adults (age 54-75 years and LDL-C >=70 to <190 mg/dL) who do not have diabetes or established atherosclerotic cardiovascular disease (ASCVD).* In intermediate-risk (10-year ASCVD risk >=7.5% to <20%) adults or selected borderline-risk (10-year ASCVD risk >=5% to <7.5%) adults in whom a CAC score is measured for the purpose of making a treatment decision the following recommendations have been made:   If CAC=0, it is reasonable to withhold statin therapy and reassess in 5 to 10 years, as long as higher risk conditions are absent (diabetes mellitus, family history of premature CHD in first degree relatives (males <55 years; females <65 years), cigarette smoking, or LDL >=190 mg/dL).   If CAC is 1 to 99, it is reasonable to initiate statin therapy for patients >=41 years of age.   If CAC is >=100 or >=75th percentile, it is reasonable to initiate statin therapy at any age.   Cardiology referral should be considered for patients with CAC scores >=400 or >=75th percentile.   *2018 AHA/ACC/AACVPR/AAPA/ABC/ACPM/ADA/AGS/APhA/ASPC/NLA/PCNA Guideline on the Management of Blood Cholesterol: A Report of the American College of Cardiology/American Heart Association Task Force on Clinical Practice Guidelines. J Am Coll Cardiol. 2019;73(24):3168-3209.     Electronically Signed   By: Candee Furbish MD   On: 03/06/2021 12:13  Assessment & Plan   1.   Coronary calcium-had coronary CT which showed coronary calcium score of 51.  Details above. Continue rosuvastatin Heart healthy low-sodium high-fiber diet Repeat fasting lipids and LFTs 6-8 weeks  Dyspnea-stable.  Echocardiogram showed normal EF with mild LVH and G1 DD Increase physical activity as tolerated Reassured that dyspnea is not related to cardiac issues  Hyperlipidemia-LDL 90 on 6/23.  Goal LDL less than 70 Continue rosuvastatin Prior healthy low-sodium high-fiber diet Increase physical activity as tolerated  Aortic dilation-noted to have 41 mm dilation of ascending aorta and on cardiac CT.  Blood pressure well controlled. Repeat echocardiogram 8/24  Disposition: Follow-up with Dr. Gardiner Rhyme in 6 months.   Jossie Ng. Marykay Mccleod NP-C  03/12/2022, 11:51 AM [address] [place] [address] Office [phone] Fax [phone]  Notice: This dictation was prepared with Dragon dictation along with smaller phrase technology. Any transcriptional errors that result from this process are unintentional and may not be corrected upon review.  I spent 14 minutes examining this patient, reviewing medications, and using patient centered shared decision making involving her cardiac care.  Prior to her visit I spent greater than 20 minutes reviewing her past medical history,  medications, and prior cardiac tests.

## 2022-03-12 ENCOUNTER — Ambulatory Visit: Payer: BC Managed Care – PPO | Admitting: General Practice

## 2022-03-12 ENCOUNTER — Encounter: Payer: Self-pay | Admitting: General Practice

## 2022-03-12 VITALS — BP 116/80 | HR 79 | Ht 70.0 in | Wt 166.4 lb

## 2022-03-12 DIAGNOSIS — R931 Abnormal findings on diagnostic imaging of heart and coronary circulation: Secondary | ICD-10-CM

## 2022-03-12 DIAGNOSIS — I77819 Aortic ectasia, unspecified site: Secondary | ICD-10-CM

## 2022-03-12 DIAGNOSIS — E785 Hyperlipidemia, unspecified: Secondary | ICD-10-CM | POA: Diagnosis not present

## 2022-03-12 DIAGNOSIS — R0602 Shortness of breath: Secondary | ICD-10-CM | POA: Diagnosis not present

## 2022-03-12 NOTE — Patient Instructions (Signed)
Medication Instructions:  The current medical regimen is effective;  continue present plan and medications as directed. Please refer to the Current Medication list given to you today.   *If you need a refill on your cardiac medications before your next appointment, please call your pharmacy*  Lab Work:    FASTING LIPID & LFT 6 WEEKS If you have labs (blood work) drawn today and your tests are completely normal, you will receive your results only by:  1-MyChart Message (if you have MyChart) OR  2-A paper copy in the mail.  If you have any lab test that is abnormal or we need to change your treatment, we will call you to review the results.  You may also go to any of these [facility] locations:  [id number],  [place] [address] ([facility])  [address]- [address]  [place] [address] [facility])  Testing/Procedures:  [employer]  Special Instructions TAKE AND LOG YOUR BLOOD PRESSURE  PLEASE INCREASE PHYSICAL ACTIVITY-AS TOLERATED  Follow-Up: Your next appointment:  6 month(s) In Person with Donato Heinz, MD    Please call our office 2 months in advance to schedule this appointment   At [facility], you and your health needs are our priority.  As part of our continuing mission to provide you with exceptional heart care, we have created designated Provider Care Teams.  These Care Teams include your primary Cardiologist (physician) and Advanced Practice Providers (APPs -  Physician Assistants and Nurse Practitioners) who all work together to provide you with the care you need, when you need it.  Important Information About Sugar

## 2022-03-17 ENCOUNTER — Ambulatory Visit
Admission: RE | Admit: 2022-03-17 | Discharge: 2022-03-17 | Disposition: A | Discharge status: Home / Self Care | Payer: BC Managed Care – PPO | Source: Ambulatory Visit | Attending: Acute Care | Admitting: Acute Care

## 2022-03-17 DIAGNOSIS — I77810 Thoracic aortic ectasia: Secondary | ICD-10-CM | POA: Diagnosis not present

## 2022-03-17 DIAGNOSIS — I2510 Atherosclerotic heart disease of native coronary artery without angina pectoris: Secondary | ICD-10-CM | POA: Diagnosis not present

## 2022-03-17 DIAGNOSIS — Z87891 Personal history of nicotine dependence: Secondary | ICD-10-CM

## 2022-03-17 DIAGNOSIS — Z122 Encounter for screening for malignant neoplasm of respiratory organs: Secondary | ICD-10-CM

## 2022-03-17 DIAGNOSIS — J439 Emphysema, unspecified: Secondary | ICD-10-CM | POA: Diagnosis not present

## 2022-03-20 ENCOUNTER — Other Ambulatory Visit: Payer: Self-pay | Admitting: Acute Care

## 2022-03-20 DIAGNOSIS — Z122 Encounter for screening for malignant neoplasm of respiratory organs: Secondary | ICD-10-CM

## 2022-03-20 DIAGNOSIS — Z87891 Personal history of nicotine dependence: Secondary | ICD-10-CM

## 2022-05-11 ENCOUNTER — Other Ambulatory Visit: Payer: Self-pay | Admitting: Cardiology

## 2022-07-29 DIAGNOSIS — Z01818 Encounter for other preprocedural examination: Secondary | ICD-10-CM | POA: Diagnosis not present

## 2022-07-29 DIAGNOSIS — Z86010 Personal history of colonic polyps: Secondary | ICD-10-CM | POA: Diagnosis not present

## 2022-07-31 DIAGNOSIS — H1712 Central corneal opacity, left eye: Secondary | ICD-10-CM | POA: Diagnosis not present

## 2022-07-31 DIAGNOSIS — H338 Other retinal detachments: Secondary | ICD-10-CM | POA: Diagnosis not present

## 2022-08-13 DIAGNOSIS — D122 Benign neoplasm of ascending colon: Secondary | ICD-10-CM | POA: Diagnosis not present

## 2022-08-13 DIAGNOSIS — K648 Other hemorrhoids: Secondary | ICD-10-CM | POA: Diagnosis not present

## 2022-08-13 DIAGNOSIS — Z86010 Personal history of colonic polyps: Secondary | ICD-10-CM | POA: Diagnosis not present

## 2022-08-13 DIAGNOSIS — D123 Benign neoplasm of transverse colon: Secondary | ICD-10-CM | POA: Diagnosis not present

## 2022-08-13 DIAGNOSIS — K5730 Diverticulosis of large intestine without perforation or abscess without bleeding: Secondary | ICD-10-CM | POA: Diagnosis not present

## 2022-08-29 ENCOUNTER — Other Ambulatory Visit: Payer: Self-pay | Admitting: Cardiology

## 2022-11-26 ENCOUNTER — Other Ambulatory Visit: Payer: Self-pay | Admitting: Cardiology

## 2022-12-23 DIAGNOSIS — M545 Low back pain, unspecified: Secondary | ICD-10-CM | POA: Diagnosis not present

## 2022-12-24 ENCOUNTER — Ambulatory Visit
Admission: RE | Admit: 2022-12-24 | Discharge: 2022-12-24 | Disposition: A | Discharge status: Home / Self Care | Payer: BC Managed Care – PPO | Source: Ambulatory Visit | Attending: Family Medicine | Admitting: Family Medicine

## 2022-12-24 ENCOUNTER — Other Ambulatory Visit: Payer: Self-pay | Admitting: Family Medicine

## 2022-12-24 DIAGNOSIS — M545 Low back pain, unspecified: Secondary | ICD-10-CM

## 2022-12-24 DIAGNOSIS — M549 Dorsalgia, unspecified: Secondary | ICD-10-CM | POA: Diagnosis not present

## 2023-01-21 DIAGNOSIS — Z0000 Encounter for general adult medical examination without abnormal findings: Secondary | ICD-10-CM | POA: Diagnosis not present

## 2023-01-21 DIAGNOSIS — Z125 Encounter for screening for malignant neoplasm of prostate: Secondary | ICD-10-CM | POA: Diagnosis not present

## 2023-01-21 DIAGNOSIS — E559 Vitamin D deficiency, unspecified: Secondary | ICD-10-CM | POA: Diagnosis not present

## 2023-01-21 DIAGNOSIS — Z13220 Encounter for screening for lipoid disorders: Secondary | ICD-10-CM | POA: Diagnosis not present

## 2023-01-21 DIAGNOSIS — R7301 Impaired fasting glucose: Secondary | ICD-10-CM | POA: Diagnosis not present

## 2023-01-21 DIAGNOSIS — E538 Deficiency of other specified B group vitamins: Secondary | ICD-10-CM | POA: Diagnosis not present

## 2023-01-29 DIAGNOSIS — H35372 Puckering of macula, left eye: Secondary | ICD-10-CM | POA: Diagnosis not present

## 2023-01-29 DIAGNOSIS — H338 Other retinal detachments: Secondary | ICD-10-CM | POA: Diagnosis not present

## 2023-02-03 DIAGNOSIS — L719 Rosacea, unspecified: Secondary | ICD-10-CM | POA: Diagnosis not present

## 2023-02-03 DIAGNOSIS — I10 Essential (primary) hypertension: Secondary | ICD-10-CM | POA: Diagnosis not present

## 2023-02-03 DIAGNOSIS — E538 Deficiency of other specified B group vitamins: Secondary | ICD-10-CM | POA: Diagnosis not present

## 2023-02-03 DIAGNOSIS — R55 Syncope and collapse: Secondary | ICD-10-CM | POA: Diagnosis not present

## 2023-02-03 DIAGNOSIS — Z0000 Encounter for general adult medical examination without abnormal findings: Secondary | ICD-10-CM | POA: Diagnosis not present

## 2023-02-08 ENCOUNTER — Other Ambulatory Visit: Payer: Self-pay

## 2023-02-08 ENCOUNTER — Emergency Department (HOSPITAL_COMMUNITY): Payer: BC Managed Care – PPO | Admitting: Anesthesiology

## 2023-02-08 ENCOUNTER — Emergency Department (HOSPITAL_COMMUNITY): Payer: BC Managed Care – PPO

## 2023-02-08 ENCOUNTER — Observation Stay (HOSPITAL_COMMUNITY)
Admission: EM | Admit: 2023-02-08 | Discharge: 2023-02-09 | Disposition: A | Discharge status: Home / Self Care | Payer: BC Managed Care – PPO | Attending: General Surgery | Admitting: General Surgery

## 2023-02-08 ENCOUNTER — Encounter (HOSPITAL_COMMUNITY): Payer: Self-pay | Admitting: Emergency Medicine

## 2023-02-08 ENCOUNTER — Encounter (HOSPITAL_COMMUNITY)
Admission: EM | Disposition: A | Discharge status: Home / Self Care | Payer: Self-pay | Source: Home / Self Care | Attending: Emergency Medicine

## 2023-02-08 DIAGNOSIS — Z79899 Other long term (current) drug therapy: Secondary | ICD-10-CM | POA: Insufficient documentation

## 2023-02-08 DIAGNOSIS — J449 Chronic obstructive pulmonary disease, unspecified: Secondary | ICD-10-CM | POA: Insufficient documentation

## 2023-02-08 DIAGNOSIS — K429 Umbilical hernia without obstruction or gangrene: Secondary | ICD-10-CM | POA: Diagnosis not present

## 2023-02-08 DIAGNOSIS — E871 Hypo-osmolality and hyponatremia: Secondary | ICD-10-CM | POA: Diagnosis not present

## 2023-02-08 DIAGNOSIS — K5730 Diverticulosis of large intestine without perforation or abscess without bleeding: Secondary | ICD-10-CM | POA: Diagnosis not present

## 2023-02-08 DIAGNOSIS — Z87891 Personal history of nicotine dependence: Secondary | ICD-10-CM | POA: Insufficient documentation

## 2023-02-08 DIAGNOSIS — R1013 Epigastric pain: Secondary | ICD-10-CM | POA: Diagnosis not present

## 2023-02-08 DIAGNOSIS — I10 Essential (primary) hypertension: Secondary | ICD-10-CM | POA: Diagnosis not present

## 2023-02-08 DIAGNOSIS — Z7982 Long term (current) use of aspirin: Secondary | ICD-10-CM | POA: Diagnosis not present

## 2023-02-08 DIAGNOSIS — K353 Acute appendicitis with localized peritonitis, without perforation or gangrene: Secondary | ICD-10-CM | POA: Diagnosis not present

## 2023-02-08 DIAGNOSIS — K3580 Unspecified acute appendicitis: Secondary | ICD-10-CM | POA: Diagnosis present

## 2023-02-08 DIAGNOSIS — R1110 Vomiting, unspecified: Secondary | ICD-10-CM | POA: Diagnosis not present

## 2023-02-08 HISTORY — DX: Chronic obstructive pulmonary disease, unspecified: J44.9

## 2023-02-08 HISTORY — DX: Essential (primary) hypertension: I10

## 2023-02-08 HISTORY — PX: LAPAROSCOPIC APPENDECTOMY: SHX408

## 2023-02-08 LAB — URINALYSIS, ROUTINE W REFLEX MICROSCOPIC
Glucose, UA: NEGATIVE mg/dL
Hgb urine dipstick: NEGATIVE
Ketones, ur: 20 mg/dL — AB
Leukocytes,Ua: NEGATIVE
Nitrite: NEGATIVE
Protein, ur: 30 mg/dL — AB
Specific Gravity, Urine: 1.026 (ref 1.005–1.030)
pH: 5 (ref 5.0–8.0)

## 2023-02-08 LAB — COMPREHENSIVE METABOLIC PANEL
ALT: 53 U/L — ABNORMAL HIGH (ref 0–44)
AST: 84 U/L — ABNORMAL HIGH (ref 15–41)
Albumin: 4.4 g/dL (ref 3.5–5.0)
Alkaline Phosphatase: 73 U/L (ref 38–126)
Anion gap: 12 (ref 5–15)
BUN: 11 mg/dL (ref 6–20)
CO2: 21 mmol/L — ABNORMAL LOW (ref 22–32)
Calcium: 9 mg/dL (ref 8.9–10.3)
Chloride: 95 mmol/L — ABNORMAL LOW (ref 98–111)
Creatinine, Ser: 0.94 mg/dL (ref 0.61–1.24)
GFR, Estimated: >60 mL/min (ref >60)
Glucose, Bld: 143 mg/dL — ABNORMAL HIGH (ref 70–99)
Potassium: 3.3 mmol/L — ABNORMAL LOW (ref 3.5–5.1)
Sodium: 128 mmol/L — ABNORMAL LOW (ref 135–145)
Total Bilirubin: 1.1 mg/dL (ref 0.3–1.2)
Total Protein: 7.5 g/dL (ref 6.5–8.1)

## 2023-02-08 LAB — CBC
HCT: 38 % — ABNORMAL LOW (ref 39.0–52.0)
Hemoglobin: 12.7 g/dL — ABNORMAL LOW (ref 13.0–17.0)
MCH: 32.4 pg (ref 26.0–34.0)
MCHC: 33.4 g/dL (ref 30.0–36.0)
MCV: 96.9 fL (ref 80.0–100.0)
Platelets: 251 10*3/uL (ref 150–400)
RBC: 3.92 MIL/uL — ABNORMAL LOW (ref 4.22–5.81)
RDW: 12.6 % (ref 11.5–15.5)
WBC: 14 10*3/uL — ABNORMAL HIGH (ref 4.0–10.5)
nRBC: 0 % (ref 0.0–0.2)

## 2023-02-08 LAB — HIV ANTIBODY (ROUTINE TESTING W REFLEX): HIV Screen 4th Generation wRfx: NONREACTIVE

## 2023-02-08 LAB — LIPASE, BLOOD: Lipase: 46 U/L (ref 11–51)

## 2023-02-08 SURGERY — APPENDECTOMY, LAPAROSCOPIC
Anesthesia: General | Site: Abdomen

## 2023-02-08 MED ORDER — LORAZEPAM 1 MG PO TABS: 1.0000 mg | ORAL_TABLET | ORAL | Status: DC | PRN

## 2023-02-08 MED ORDER — FOLIC ACID 1 MG PO TABS
1.0000 mg | ORAL_TABLET | Freq: Every day | ORAL | Status: DC
  Administered 2023-02-08 – 2023-02-09 (×2): 1 mg via ORAL
  Filled 2023-02-08 (×2): qty 1

## 2023-02-08 MED ORDER — POLYETHYLENE GLYCOL 3350 17 G PO PACK: 17.0000 g | PACK | Freq: Every day | ORAL | Status: DC | PRN

## 2023-02-08 MED ORDER — ORAL CARE MOUTH RINSE: 15.0000 mL | Freq: Once | OROMUCOSAL | Status: AC

## 2023-02-08 MED ORDER — AMISULPRIDE (ANTIEMETIC) 5 MG/2ML IV SOLN: 10.0000 mg | Freq: Once | INTRAVENOUS | Status: DC | PRN

## 2023-02-08 MED ORDER — ADULT MULTIVITAMIN W/MINERALS CH
1.0000 | ORAL_TABLET | Freq: Every day | ORAL | Status: DC
  Administered 2023-02-08 – 2023-02-09 (×2): 1 via ORAL
  Filled 2023-02-08 (×2): qty 1

## 2023-02-08 MED ORDER — OXYCODONE HCL 5 MG/5ML PO SOLN: 5.0000 mg | Freq: Once | ORAL | Status: DC | PRN

## 2023-02-08 MED ORDER — METHOCARBAMOL 500 MG PO TABS: 500.0000 mg | ORAL_TABLET | Freq: Three times a day (TID) | ORAL | Status: DC | PRN

## 2023-02-08 MED ORDER — HYDROMORPHONE HCL 1 MG/ML IJ SOLN
0.5000 mg | INTRAMUSCULAR | Status: DC | PRN
  Administered 2023-02-08: 0.5 mg via INTRAVENOUS
  Filled 2023-02-08: qty 1

## 2023-02-08 MED ORDER — KETOROLAC TROMETHAMINE 30 MG/ML IJ SOLN
INTRAMUSCULAR | Status: AC
  Filled 2023-02-08: qty 1

## 2023-02-08 MED ORDER — DIPHENHYDRAMINE HCL 12.5 MG/5ML PO ELIX: 12.5000 mg | ORAL_SOLUTION | Freq: Four times a day (QID) | ORAL | Status: DC | PRN

## 2023-02-08 MED ORDER — ONDANSETRON HCL 4 MG/2ML IJ SOLN: 4.0000 mg | Freq: Four times a day (QID) | INTRAMUSCULAR | Status: DC | PRN

## 2023-02-08 MED ORDER — DEXAMETHASONE SODIUM PHOSPHATE 10 MG/ML IJ SOLN
INTRAMUSCULAR | Status: DC | PRN
  Administered 2023-02-08: 10 mg via INTRAVENOUS

## 2023-02-08 MED ORDER — ONDANSETRON HCL 4 MG/2ML IJ SOLN
INTRAMUSCULAR | Status: DC | PRN
Start: 2023-02-08 — End: 2023-02-08
  Administered 2023-02-08: 4 mg via INTRAVENOUS

## 2023-02-08 MED ORDER — SCOPOLAMINE 1 MG/3DAYS TD PT72: 1.0000 | MEDICATED_PATCH | TRANSDERMAL | Status: DC

## 2023-02-08 MED ORDER — HYDRALAZINE HCL 20 MG/ML IJ SOLN: 10.0000 mg | INTRAMUSCULAR | Status: DC | PRN

## 2023-02-08 MED ORDER — SIMETHICONE 80 MG PO CHEW: 40.0000 mg | CHEWABLE_TABLET | Freq: Four times a day (QID) | ORAL | Status: DC | PRN

## 2023-02-08 MED ORDER — LACTATED RINGERS IV SOLN: INTRAVENOUS | Status: DC

## 2023-02-08 MED ORDER — FENTANYL CITRATE (PF) 100 MCG/2ML IJ SOLN: 25.0000 ug | INTRAMUSCULAR | Status: DC | PRN

## 2023-02-08 MED ORDER — ACETAMINOPHEN 500 MG PO TABS: 1000.0000 mg | ORAL_TABLET | Freq: Once | ORAL | Status: AC

## 2023-02-08 MED ORDER — ROCURONIUM BROMIDE 10 MG/ML (PF) SYRINGE
PREFILLED_SYRINGE | INTRAVENOUS | Status: AC
  Filled 2023-02-08: qty 20

## 2023-02-08 MED ORDER — ACETAMINOPHEN 10 MG/ML IV SOLN: 1000.0000 mg | Freq: Once | INTRAVENOUS | Status: DC | PRN

## 2023-02-08 MED ORDER — IOHEXOL 350 MG/ML SOLN
75.0000 mL | Freq: Once | INTRAVENOUS | Status: AC | PRN
  Administered 2023-02-08: 75 mL via INTRAVENOUS

## 2023-02-08 MED ORDER — PANTOPRAZOLE SODIUM 40 MG IV SOLR
40.0000 mg | INTRAVENOUS | Status: DC
  Administered 2023-02-08 – 2023-02-09 (×2): 40 mg via INTRAVENOUS
  Filled 2023-02-08 (×2): qty 10

## 2023-02-08 MED ORDER — ONDANSETRON 4 MG PO TBDP: 4.0000 mg | ORAL_TABLET | Freq: Four times a day (QID) | ORAL | Status: DC | PRN

## 2023-02-08 MED ORDER — SUGAMMADEX SODIUM 200 MG/2ML IV SOLN
INTRAVENOUS | Status: DC | PRN
  Administered 2023-02-08: 200 mg via INTRAVENOUS

## 2023-02-08 MED ORDER — MIDAZOLAM HCL 2 MG/2ML IJ SOLN
INTRAMUSCULAR | Status: DC | PRN
  Administered 2023-02-08: 2 mg via INTRAVENOUS

## 2023-02-08 MED ORDER — DIPHENHYDRAMINE HCL 50 MG/ML IJ SOLN: 12.5000 mg | Freq: Four times a day (QID) | INTRAMUSCULAR | Status: DC | PRN

## 2023-02-08 MED ORDER — DIPHENHYDRAMINE HCL 50 MG/ML IJ SOLN
INTRAMUSCULAR | Status: DC | PRN
  Administered 2023-02-08: 12.5 mg via INTRAVENOUS

## 2023-02-08 MED ORDER — 0.9 % SODIUM CHLORIDE (POUR BTL) OPTIME
TOPICAL | Status: DC | PRN
  Administered 2023-02-08: 1000 mL

## 2023-02-08 MED ORDER — BUPIVACAINE-EPINEPHRINE 0.25% -1:200000 IJ SOLN
INTRAMUSCULAR | Status: DC | PRN
  Administered 2023-02-08: 18 mL

## 2023-02-08 MED ORDER — LIDOCAINE 2% (20 MG/ML) 5 ML SYRINGE
INTRAMUSCULAR | Status: AC
  Filled 2023-02-08: qty 10

## 2023-02-08 MED ORDER — SODIUM CHLORIDE 0.9 % IV BOLUS
1000.0000 mL | Freq: Once | INTRAVENOUS | Status: AC
  Administered 2023-02-08: 1000 mL via INTRAVENOUS

## 2023-02-08 MED ORDER — CHLORHEXIDINE GLUCONATE 0.12 % MT SOLN
15.0000 mL | Freq: Once | OROMUCOSAL | Status: AC
  Administered 2023-02-08: 15 mL via OROMUCOSAL
  Filled 2023-02-08 (×2): qty 15

## 2023-02-08 MED ORDER — METHOCARBAMOL 1000 MG/10ML IJ SOLN: 500.0000 mg | Freq: Three times a day (TID) | INTRAVENOUS | Status: DC | PRN

## 2023-02-08 MED ORDER — FENTANYL CITRATE PF 50 MCG/ML IJ SOSY
50.0000 ug | PREFILLED_SYRINGE | Freq: Once | INTRAMUSCULAR | Status: AC
  Administered 2023-02-08: 50 ug via INTRAVENOUS
  Filled 2023-02-08: qty 1

## 2023-02-08 MED ORDER — ACETAMINOPHEN 500 MG PO TABS
1000.0000 mg | ORAL_TABLET | Freq: Four times a day (QID) | ORAL | Status: DC
  Administered 2023-02-08 – 2023-02-09 (×3): 1000 mg via ORAL
  Filled 2023-02-08 (×3): qty 2

## 2023-02-08 MED ORDER — ONDANSETRON HCL 4 MG/2ML IJ SOLN
INTRAMUSCULAR | Status: AC
  Filled 2023-02-08: qty 2

## 2023-02-08 MED ORDER — DEXAMETHASONE SODIUM PHOSPHATE 10 MG/ML IJ SOLN
INTRAMUSCULAR | Status: AC
  Filled 2023-02-08: qty 1

## 2023-02-08 MED ORDER — MELATONIN 3 MG PO TABS: 3.0000 mg | ORAL_TABLET | Freq: Every evening | ORAL | Status: DC | PRN

## 2023-02-08 MED ORDER — LIDOCAINE 2% (20 MG/ML) 5 ML SYRINGE
INTRAMUSCULAR | Status: DC | PRN
  Administered 2023-02-08: 100 mg via INTRAVENOUS

## 2023-02-08 MED ORDER — FENTANYL CITRATE (PF) 250 MCG/5ML IJ SOLN
INTRAMUSCULAR | Status: DC | PRN
  Administered 2023-02-08 (×3): 50 ug via INTRAVENOUS

## 2023-02-08 MED ORDER — SUCCINYLCHOLINE CHLORIDE 200 MG/10ML IV SOSY
PREFILLED_SYRINGE | INTRAVENOUS | Status: DC | PRN
  Administered 2023-02-08: 120 mg via INTRAVENOUS

## 2023-02-08 MED ORDER — ROCURONIUM BROMIDE 10 MG/ML (PF) SYRINGE
PREFILLED_SYRINGE | INTRAVENOUS | Status: AC
  Filled 2023-02-08: qty 10

## 2023-02-08 MED ORDER — ROCURONIUM BROMIDE 10 MG/ML (PF) SYRINGE
PREFILLED_SYRINGE | INTRAVENOUS | Status: DC | PRN
  Administered 2023-02-08: 20 mg via INTRAVENOUS
  Administered 2023-02-08: 40 mg via INTRAVENOUS

## 2023-02-08 MED ORDER — MORPHINE SULFATE (PF) 4 MG/ML IV SOLN
4.0000 mg | Freq: Once | INTRAVENOUS | Status: AC
  Administered 2023-02-08: 4 mg via INTRAVENOUS
  Filled 2023-02-08: qty 1

## 2023-02-08 MED ORDER — ENOXAPARIN SODIUM 40 MG/0.4ML IJ SOSY
40.0000 mg | PREFILLED_SYRINGE | Freq: Every day | INTRAMUSCULAR | Status: DC
  Administered 2023-02-09: 40 mg via SUBCUTANEOUS
  Filled 2023-02-08: qty 0.4

## 2023-02-08 MED ORDER — DIPHENHYDRAMINE HCL 50 MG/ML IJ SOLN
INTRAMUSCULAR | Status: AC
  Filled 2023-02-08: qty 1

## 2023-02-08 MED ORDER — OXYCODONE HCL 5 MG PO TABS: 5.0000 mg | ORAL_TABLET | Freq: Once | ORAL | Status: DC | PRN

## 2023-02-08 MED ORDER — PROPOFOL 10 MG/ML IV BOLUS
INTRAVENOUS | Status: DC | PRN
  Administered 2023-02-08: 140 mg via INTRAVENOUS

## 2023-02-08 MED ORDER — FENTANYL CITRATE (PF) 250 MCG/5ML IJ SOLN
INTRAMUSCULAR | Status: AC
  Filled 2023-02-08: qty 5

## 2023-02-08 MED ORDER — BUPIVACAINE-EPINEPHRINE (PF) 0.25% -1:200000 IJ SOLN
INTRAMUSCULAR | Status: AC
  Filled 2023-02-08: qty 30

## 2023-02-08 MED ORDER — MORPHINE SULFATE (PF) 2 MG/ML IV SOLN: 2.0000 mg | INTRAVENOUS | Status: DC | PRN

## 2023-02-08 MED ORDER — ONDANSETRON 4 MG PO TBDP
4.0000 mg | ORAL_TABLET | Freq: Once | ORAL | Status: AC
Start: 2023-02-08 — End: 2023-02-08
  Administered 2023-02-08: 4 mg via ORAL
  Filled 2023-02-08: qty 1

## 2023-02-08 MED ORDER — OXYCODONE HCL 5 MG PO TABS: 5.0000 mg | ORAL_TABLET | ORAL | Status: DC | PRN

## 2023-02-08 MED ORDER — ACETAMINOPHEN 500 MG PO TABS
ORAL_TABLET | ORAL | Status: AC
  Administered 2023-02-08: 1000 mg via ORAL
  Filled 2023-02-08: qty 2

## 2023-02-08 MED ORDER — THIAMINE MONONITRATE 100 MG PO TABS
100.0000 mg | ORAL_TABLET | Freq: Every day | ORAL | Status: DC
  Administered 2023-02-08 – 2023-02-09 (×2): 100 mg via ORAL
  Filled 2023-02-08 (×2): qty 1

## 2023-02-08 MED ORDER — SODIUM CHLORIDE 0.9 % IV SOLN: INTRAVENOUS | Status: DC

## 2023-02-08 MED ORDER — SCOPOLAMINE 1 MG/3DAYS TD PT72
MEDICATED_PATCH | TRANSDERMAL | Status: AC
  Administered 2023-02-08: 1.5 mg via TRANSDERMAL
  Filled 2023-02-08: qty 1

## 2023-02-08 MED ORDER — MIDAZOLAM HCL 2 MG/2ML IJ SOLN
INTRAMUSCULAR | Status: AC
  Filled 2023-02-08: qty 2

## 2023-02-08 MED ORDER — LORAZEPAM 2 MG/ML IJ SOLN: 1.0000 mg | INTRAMUSCULAR | Status: DC | PRN

## 2023-02-08 MED ORDER — PIPERACILLIN-TAZOBACTAM 3.375 G IVPB 30 MIN
3.3750 g | Freq: Once | INTRAVENOUS | Status: AC
  Administered 2023-02-08: 3.375 g via INTRAVENOUS
  Filled 2023-02-08: qty 50

## 2023-02-08 MED ORDER — THIAMINE HCL 100 MG/ML IJ SOLN: 100.0000 mg | Freq: Every day | INTRAMUSCULAR | Status: DC

## 2023-02-08 MED ORDER — SODIUM CHLORIDE 0.9 % IR SOLN
Status: DC | PRN
  Administered 2023-02-08: 1

## 2023-02-08 SURGICAL SUPPLY — 45 items
ADH SKN CLS APL DERMABOND .7 (GAUZE/BANDAGES/DRESSINGS) ×1
APL PRP STRL LF DISP 70% ISPRP (MISCELLANEOUS) ×1
APPLIER CLIP ROT 10 11.4 M/L (STAPLE)
APR CLP MED LRG 11.4X10 (STAPLE)
BAG COUNTER SPONGE SURGICOUNT (BAG) ×2 IMPLANT
BAG SPNG CNTER NS LX DISP (BAG)
BLADE CLIPPER SURG (BLADE) IMPLANT
CANISTER SUCT 3000ML PPV (MISCELLANEOUS) ×2 IMPLANT
CHLORAPREP W/TINT 26 (MISCELLANEOUS) ×2 IMPLANT
CLIP APPLIE ROT 10 11.4 M/L (STAPLE) IMPLANT
COVER SURGICAL LIGHT HANDLE (MISCELLANEOUS) ×2 IMPLANT
CUTTER FLEX LINEAR 45M (STAPLE) ×2 IMPLANT
DERMABOND ADVANCED .7 DNX12 (GAUZE/BANDAGES/DRESSINGS) ×2 IMPLANT
ELECT REM PT RETURN 9FT ADLT (ELECTROSURGICAL) ×1
ELECTRODE REM PT RTRN 9FT ADLT (ELECTROSURGICAL) ×2 IMPLANT
ENDOLOOP SUT PDS II 0 18 (SUTURE) IMPLANT
GLOVE BIO SURGEON STRL SZ7.5 (GLOVE) ×2 IMPLANT
GOWN STRL REUS W/ TWL LRG LVL3 (GOWN DISPOSABLE) ×6 IMPLANT
GOWN STRL REUS W/TWL LRG LVL3 (GOWN DISPOSABLE) ×3
IRRIG SUCT STRYKERFLOW 2 WTIP (MISCELLANEOUS) ×1
IRRIGATION SUCT STRKRFLW 2 WTP (MISCELLANEOUS) ×2 IMPLANT
KIT BASIN OR (CUSTOM PROCEDURE TRAY) ×2 IMPLANT
KIT TURNOVER KIT B (KITS) ×2 IMPLANT
NS IRRIG 1000ML POUR BTL (IV SOLUTION) ×2 IMPLANT
PAD ARMBOARD 7.5X6 YLW CONV (MISCELLANEOUS) ×4 IMPLANT
RELOAD 45 THICK GREEN (ENDOMECHANICALS) ×1 IMPLANT
RELOAD STAPLE 45 3.5 BLU ETS (ENDOMECHANICALS) IMPLANT
RELOAD STAPLE 45 GRN THCK ETS (ENDOMECHANICALS) IMPLANT
RELOAD STAPLE TA45 3.5 REG BLU (ENDOMECHANICALS) IMPLANT
SET TUBE SMOKE EVAC HIGH FLOW (TUBING) ×2 IMPLANT
SHEARS HARMONIC ACE PLUS 36CM (ENDOMECHANICALS) ×2 IMPLANT
SLEEVE Z-THREAD 5X100MM (TROCAR) IMPLANT
SPECIMEN JAR SMALL (MISCELLANEOUS) ×2 IMPLANT
SUT MNCRL AB 4-0 PS2 18 (SUTURE) ×2 IMPLANT
SYS BAG RETRIEVAL 10MM (BASKET) ×1
SYSTEM BAG RETRIEVAL 10MM (BASKET) ×2 IMPLANT
TOWEL GREEN STERILE (TOWEL DISPOSABLE) ×2 IMPLANT
TOWEL GREEN STERILE FF (TOWEL DISPOSABLE) ×2 IMPLANT
TRAY FOLEY W/BAG SLVR 16FR (SET/KITS/TRAYS/PACK) ×1
TRAY FOLEY W/BAG SLVR 16FR ST (SET/KITS/TRAYS/PACK) ×2 IMPLANT
TRAY LAPAROSCOPIC MC (CUSTOM PROCEDURE TRAY) ×2 IMPLANT
TROCAR BALLN 12MMX100 BLUNT (TROCAR) ×2 IMPLANT
TROCAR Z-THREAD OPTICAL 5X100M (TROCAR) ×4 IMPLANT
WARMER LAPAROSCOPE (MISCELLANEOUS) ×2 IMPLANT
WATER STERILE IRR 1000ML POUR (IV SOLUTION) ×2 IMPLANT

## 2023-02-08 NOTE — Anesthesia Preprocedure Evaluation (Addendum)
Anesthesia Evaluation  Patient identified by MRN, date of birth, ID band Patient awake    Reviewed: Allergy & Precautions, NPO status , Patient's Chart, lab work & pertinent test results  History of Anesthesia Complications Negative for: history of anesthetic complications  Airway Mallampati: III  TM Distance: >3 FB Neck ROM: Full    Dental no notable dental hx. (+) Dental Advisory Given   Pulmonary COPD, former smoker   Pulmonary exam normal        Cardiovascular hypertension, Normal cardiovascular exam  03/07/2021 TTE IMPRESSIONS     1. Left ventricular ejection fraction, by estimation, is 55 to 60%. The  left ventricle has normal function. The left ventricle has no regional  wall motion abnormalities. There is mild left ventricular hypertrophy of  the basal-septal segment. Left  ventricular diastolic parameters are consistent with Grade I diastolic  dysfunction (impaired relaxation). The average left ventricular global  longitudinal strain is -19.7 %. The global longitudinal strain is normal.   2. Right ventricular systolic function is normal. The right ventricular  size is normal.   3. The mitral valve is normal in structure. Mild mitral valve  regurgitation. No evidence of mitral stenosis.   4. The aortic valve is tricuspid. Aortic valve regurgitation is not  visualized. Mild aortic valve sclerosis is present, with no evidence of  aortic valve stenosis.   5. Aortic dilatation noted. There is mild dilatation of the aortic root,  measuring 40 mm.   6. The inferior vena cava is normal in size with greater than 50%  respiratory variability, suggesting right atrial pressure of 3 mmHg.     Neuro/Psych negative neurological ROS     GI/Hepatic Neg liver ROS,,,  Endo/Other  negative endocrine ROS    Renal/GU negative Renal ROS     Musculoskeletal negative musculoskeletal ROS (+)    Abdominal   Peds   Hematology  (+) Blood dyscrasia, anemia   Anesthesia Other Findings   Reproductive/Obstetrics                             Anesthesia Physical Anesthesia Plan  ASA: 2  Anesthesia Plan: General   Post-op Pain Management: Tylenol PO (pre-op)*   Induction: Intravenous  PONV Risk Score and Plan: 4 or greater and Ondansetron, Dexamethasone, Midazolam and Scopolamine patch - Pre-op  Airway Management Planned: Oral ETT  Additional Equipment: None  Intra-op Plan:   Post-operative Plan: Extubation in OR  Informed Consent: I have reviewed the patients History and Physical, chart, labs and discussed the procedure including the risks, benefits and alternatives for the proposed anesthesia with the patient or authorized representative who has indicated his/her understanding and acceptance.     Dental advisory given  Plan Discussed with: Anesthesiologist and CRNA  Anesthesia Plan Comments:        Anesthesia Quick Evaluation

## 2023-02-08 NOTE — Interval H&P Note (Signed)
History and Physical Interval Note:  02/08/2023 10:05 AM  Thomas Kennedy  has presented today for surgery, with the diagnosis of Acute Appendicitis.  The various methods of treatment have been discussed with the patient and family. After consideration of risks, benefits and other options for treatment, the patient has consented to  Procedure(s): APPENDECTOMY LAPAROSCOPIC (N/A) as a surgical intervention.  The patient's history has been reviewed, patient examined, no change in status, stable for surgery.  I have reviewed the patient's chart and labs.  Questions were answered to the patient's satisfaction.     Chevis Pretty III

## 2023-02-08 NOTE — Anesthesia Procedure Notes (Signed)
Procedure Name: Intubation Date/Time: 02/08/2023 10:38 AM  Performed by: Marena Chancy, CRNAPre-anesthesia Checklist: Patient identified, Emergency Drugs available, Suction available and Patient being monitored Patient Re-evaluated:Patient Re-evaluated prior to induction Oxygen Delivery Method: Circle System Utilized Preoxygenation: Pre-oxygenation with 100% oxygen Induction Type: IV induction Ventilation: Mask ventilation without difficulty Laryngoscope Size: Miller and 2 Grade View: Grade III Tube type: Oral Tube size: 7.5 mm Number of attempts: 1 Airway Equipment and Method: Stylet and Oral airway Placement Confirmation: ETT inserted through vocal cords under direct vision, positive ETCO2 and breath sounds checked- equal and bilateral Tube secured with: Tape Dental Injury: Teeth and Oropharynx as per pre-operative assessment  Difficulty Due To: Difficulty was unanticipated and Difficult Airway- due to anterior larynx Comments: Crna unable to view cords with miller 2.  Anesthesiologist able to intubate with cricoid manipulation with miller 2 blade.

## 2023-02-08 NOTE — Discharge Instructions (Signed)

## 2023-02-08 NOTE — Anesthesia Postprocedure Evaluation (Signed)
Anesthesia Post Note  Patient: Thomas Kennedy  Procedure(s) Performed: APPENDECTOMY LAPAROSCOPIC (Abdomen)     Patient location during evaluation: PACU Anesthesia Type: General Level of consciousness: sedated Pain management: pain level controlled Vital Signs Assessment: post-procedure vital signs reviewed and stable Respiratory status: spontaneous breathing and respiratory function stable Cardiovascular status: stable Postop Assessment: no apparent nausea or vomiting Anesthetic complications: yes  Encounter Notable Events  Notable Event Outcome Phase Comment  Difficult to intubate - unexpected  Intraprocedure Filed from anesthesia note documentation.    Last Vitals:  Vitals:   02/08/23 1230 02/08/23 1245  BP: 137/86 (!) 138/95  Pulse: 84 79  Resp: 14 19  Temp:  36.7 C  SpO2: 95% 94%    Last Pain:  Vitals:   02/08/23 1245  TempSrc:   PainSc: 0-No pain                 Rosamond Andress DANIEL

## 2023-02-08 NOTE — ED Triage Notes (Signed)
Pt c/o abdominal pain with N/V since Saturday.

## 2023-02-08 NOTE — Op Note (Signed)
02/08/2023  11:38 AM  PATIENT:  Beulah Gandy Bagot  62 y.o. male  PRE-OPERATIVE DIAGNOSIS:  Acute Appendicitis  POST-OPERATIVE DIAGNOSIS:  Acute Appendicitis  PROCEDURE:  Procedure(s): APPENDECTOMY LAPAROSCOPIC (N/A)  SURGEON:  Surgeons and Role:    Griselda Miner, MD - Primary  PHYSICIAN ASSISTANT:   ASSISTANTS: none   ANESTHESIA:   local and general  EBL:  minimal   BLOOD ADMINISTERED:none  DRAINS: none   LOCAL MEDICATIONS USED:  MARCAINE     SPECIMEN:  Source of Specimen:  appendix  DISPOSITION OF SPECIMEN:  PATHOLOGY  COUNTS:  YES  TOURNIQUET:  * No tourniquets in log *  DICTATION: .Dragon Dictation  After informed consent was obtained patient was brought to the operating room placed in the supine position on the operating room table. After adequate induction of general anesthesia the patient's abdomen was prepped with ChloraPrep, allowed to dry, and draped in usual sterile manner. The area below the umbilicus was infiltrated with quarter percent Marcaine. A small incision was made with a 15 blade knife. This incision was carried down through the subcutaneous tissue bluntly with a hemostat and Army-Navy retractors until the linea alba was identified. The linea alba was incised with a 15 blade knife. Each side was grasped Coker clamps and elevated anteriorly. The preperitoneal space was probed bluntly with a hemostat until the peritoneum was opened and access was gained to the abdominal cavity. A 0 Vicryl purse string stitch was placed in the fascia surrounding the opening. A Hassan cannula was placed through the opening and anchored in place with the previously placed Vicryl purse string stitch. The laparoscope was placed through the [facility] cannula. The abdomen was insufflated with carbon dioxide without difficulty. Next the suprapubic area was infiltrated with quarter percent Marcaine. A small incision was made with a 15 blade knife. A 5 mm port was placed bluntly through  this incision into the abdominal cavity. A site was then chosen between the 2 port for placement of a 5 mm port. The area was infiltrated with quarter percent Marcaine. A small stab incision was made with a 15 blade knife. A 5 mm port was placed bluntly through this incision and the abdominal cavity under direct vision. The laparoscope was then moved to the suprapubic port. Using a Glassman grasper and harmonic scalpel the right lower quadrant was inspected. The appendix was readily identified. The appendix was elevated anteriorly and the mesoappendix was taken down sharply with the harmonic scalpel. Once the base of the appendix where it joined the cecum was identified and cleared of any tissue then a laparoscopic GIA blue load 6 row stapler was placed through the [facility] cannula. The stapler was placed across the base of the appendix clamped and fired thereby dividing the base of the appendix between staple lines. A laparoscopic bag was then inserted through the [facility] cannula. The appendix was placed within the bag and the bag was sealed. The abdomen was then irrigated with copious amounts of saline until the effluent was clear. No other abnormalities were noted. The appendix and bag were removed with the [facility] cannula through the infraumbilical port without difficulty. The fascial defect was closed with the previously placed Vicryl pursestring stitch as well as with another interrupted 0 Vicryl figure-of-eight stitch. The rest of the ports were removed under direct vision and were found to be hemostatic. The gas was allowed to escape. The skin incisions were closed with interrupted 4-0 Monocryl subcuticular stitches. Dermabond dressings were applied. The  patient tolerated the procedure well. At the end of the case all needle sponge and instrument counts were correct. The patient was then awakened and taken to recovery in stable condition.  PLAN OF CARE: Admit for overnight observation  PATIENT DISPOSITION:   PACU - hemodynamically stable.   Delay start of Pharmacological VTE agent (>24hrs) due to surgical blood loss or risk of bleeding: no

## 2023-02-08 NOTE — H&P (Signed)
Thomas Kennedy - [place] January 26, 1961  [id number].    Requesting MD: Dr. Tilden Fossa Chief Complaint/Reason for Consult: Acute Appendicitis  HPI: ANDRUW Kennedy is a 62 y.o. male with a hx of HTN, COPD and reported HLD who presented with abdominal pain. Reports on 7/13 he began having epigastric abdominal pain, n/v. Pain still in his epigastrium but has since moved to his lower abdomen RLQ>LLQ. Associated chills and diarrhea x 1 on 7/13. No flatus or bm since that time. Denies fever or urinary symptoms.   Afebrile. No tachycardia or hypotension. WBC 14. CT w/ acute unruptured appendicitis with the appendicolithiasis.   Last Colonoscopy: 2-3 months ago with [facility]. Reports he did have some polyps but they were benign. I cannot see this report Prior Abdominal Surgeries: None Blood Thinners: None Last PO intake: 130am, water/gatorade Allergies: ACE-I Tobacco Use: Prior, quit 10 years ago Alcohol Use: 4-5 drinks/day Substance use: None  Employment: Works for a church rasing money.   Denies a history of MI or CVA. At baseline states he can climb a flight of stairs without stopping due to DOE or chest pain.  ROS: ROS As above, see hpi  History reviewed. No pertinent family history.  Past Medical History:  Diagnosis Date   COPD (chronic obstructive pulmonary disease) (HCC)    Hypertension     Past Surgical History:  Procedure Laterality Date   EYE SURGERY      Social History:  reports that he quit smoking about 10 years ago. His smoking use included cigarettes. He has quit using smokeless tobacco. He reports current alcohol use of about 7.0 standard drinks of alcohol per week. He reports that he does not use drugs.  Allergies:  Allergies  Allergen Reactions   Ace Inhibitors Cough    (Not in a hospital admission)    Physical Exam: Blood pressure (!) 151/96, pulse 79, temperature 98 F (36.7 C), temperature source Oral, resp. rate 16, height 5\' 10"  (1.778 m), weight 77.1  kg, SpO2 96%. General: pleasant, WD/WN male who is laying in bed in NAD HEENT: head is normocephalic, atraumatic.  Sclera are noninjected.  PERRL.  Ears and nose without any masses or lesions.  Mouth is pink and moist. Dentition fair Heart: regular, rate, and rhythm Lungs: CTAB, no wheezes, rhonchi, or rales noted.  Respiratory effort nonlabored Abd:  Soft, mild distension, generalized ttp that seems to be greatest in the RLQ. +BS. No masses, hernias, or organomegaly Skin: warm and dry  Psych: A&Ox4 with an appropriate affect Neuro: normal speech, thought process intact, moves all extremities, gait not assessed  Results for orders placed or performed during the hospital encounter of 02/08/23 (from the past 48 hour(s))  Lipase, blood     Status: None   Collection Time: 02/08/23  1:44 AM  Result Value Ref Range   Lipase 46 11 - 51 U/L    Comment: Performed at [facility], [address] [place], [place] [address]  Comprehensive metabolic panel     Status: Abnormal   Collection Time: 02/08/23  1:44 AM  Result Value Ref Range   Sodium 128 (L) 135 - 145 mmol/L   Potassium 3.3 (L) 3.5 - 5.1 mmol/L   Chloride 95 (L) 98 - 111 mmol/L   CO2 21 (L) 22 - 32 mmol/L   Glucose, Bld 143 (H) 70 - 99 mg/dL    Comment: Glucose reference range applies only to samples taken after fasting for at least 8 hours.  BUN 11 6 - 20 mg/dL   Creatinine, Ser 1.61 0.61 - 1.24 mg/dL   Calcium 9.0 8.9 - 09.6 mg/dL   Total Protein 7.5 6.5 - 8.1 g/dL   Albumin 4.4 3.5 - 5.0 g/dL   AST 84 (H) 15 - 41 U/L   ALT 53 (H) 0 - 44 U/L   Alkaline Phosphatase 73 38 - 126 U/L   Total Bilirubin 1.1 0.3 - 1.2 mg/dL   GFR, Estimated >04 >54 mL/min    Comment: (NOTE) Calculated using the CKD-EPI Creatinine Equation (2021)    Anion gap 12 5 - 15    Comment: Performed at [facility], [address] [place] [address] [place], [place] [address]  CBC     Status: Abnormal   Collection Time: 02/08/23  1:44 AM  Result Value Ref Range    WBC 14.0 (H) 4.0 - 10.5 K/uL   RBC 3.92 (L) 4.22 - 5.81 MIL/uL   Hemoglobin 12.7 (L) 13.0 - 17.0 g/dL   HCT 91.4 (L) 78.2 - 95.6 %   MCV 96.9 80.0 - 100.0 fL   MCH 32.4 26.0 - 34.0 pg   MCHC 33.4 30.0 - 36.0 g/dL   RDW 21.3 08.6 - 57.8 %   Platelets 251 150 - 400 K/uL   nRBC 0.0 0.0 - 0.2 %    Comment: Performed at [facility], [address] [place], [place] [address]  Urinalysis, Routine w reflex microscopic -Urine, Clean Catch     Status: Abnormal   Collection Time: 02/08/23  1:45 AM  Result Value Ref Range   Color, Urine YELLOW YELLOW   APPearance CLEAR CLEAR   Specific Gravity, Urine 1.026 1.005 - 1.030   pH 5.0 5.0 - 8.0   Glucose, UA NEGATIVE NEGATIVE mg/dL   Hgb urine dipstick NEGATIVE NEGATIVE   Bilirubin Urine SMALL (A) NEGATIVE   Ketones, ur 20 (A) NEGATIVE mg/dL   Protein, ur 30 (A) NEGATIVE mg/dL   Nitrite NEGATIVE NEGATIVE   Leukocytes,Ua NEGATIVE NEGATIVE   RBC / HPF 0-5 0 - 5 RBC/hpf   WBC, UA 0-5 0 - 5 WBC/hpf   Bacteria, UA RARE (A) NONE SEEN   Squamous Epithelial / HPF 0-5 0 - 5 /HPF   Mucus PRESENT     Comment: Performed at [facility], [address], [place], [place] [address]   CT ABDOMEN PELVIS W CONTRAST  Result Date: 02/08/2023 CLINICAL DATA:  Low abdominal pain.  Onset yesterday, with vomiting. EXAM: CT ABDOMEN AND PELVIS WITH CONTRAST TECHNIQUE: Multidetector CT imaging of the abdomen and pelvis was performed using the standard protocol following bolus administration of intravenous contrast. RADIATION DOSE REDUCTION: This exam was performed according to the departmental dose-optimization program which includes automated exposure control, adjustment of the mA and/or kV according to patient size and/or use of iterative reconstruction technique. CONTRAST:  75mL OMNIPAQUE IOHEXOL 350 MG/ML SOLN COMPARISON:  Limited comparison available with low-dose lung cancer screening chest CT 03/17/2022. FINDINGS: Lower chest: Lung bases are clear with  asymmetric elevation of the right hemidiaphragm chronically. There is increased mild-to-moderate thickening of the EG junction warranting endoscopic follow-up. The cardiac size is normal. Hepatobiliary: The liver is 20 cm length and mildly steatotic. There is no mass enhancement. The gallbladder and bile ducts are unremarkable. Pancreas: No abnormality. Spleen: No abnormality. Adrenals/Urinary Tract: Adrenal glands are unremarkable. Kidneys are normal, without renal calculi, focal lesion, or hydronephrosis. Bladder is unremarkable. Stomach/Bowel: Unremarkable stomach and unopacified small bowel. There is  unruptured acute appendicitis. There are small stones in the proximal and distal appendiceal lumen with dilatation to 11 mm, inflammatory reaction and trace reactive fluid posterior to the appendix. No appendiceal rupture or abscess is seen. Fluid and positive contrast is noted in the ascending colon. There is descending and sigmoid diverticulosis without evidence of an acute diverticulitis. Vascular/Lymphatic: Aortic atherosclerosis. No enlarged abdominal or pelvic lymph nodes. Reproductive: Mild prostatomegaly with dystrophic central calcifications. Other: There is a tiny umbilical fat hernia. There is no incarcerated hernia. No free fluid apart from minimal nonlocalizing reactive periappendiceal fluid there is no free air, abscess or free hemorrhage. 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 No acute or other significant osseous findings. There are healed fracture deformities of the posterior lower left ribs. IMPRESSION: 1. Acute unruptured appendicitis with the appendicolithiasis. 2. Mildly prominent liver with mild steatosis. 3. Increased thickening of the EG junction warranting endoscopic follow-up. 4. Aortic atherosclerosis. 5. 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 6. Prostatomegaly. 7. Critical  Value/emergent results were called by telephone at the time of interpretation on 02/08/2023 at 5:40 am to provider [facility] , who verbally acknowledged these results. Aortic Atherosclerosis (ICD10-I70.0). Electronically Signed   By: Almira Bar M.D.   On: 02/08/2023 05:40   DG Chest Port 1 View  Result Date: 02/08/2023 CLINICAL DATA:  Vomiting EXAM: PORTABLE CHEST 1 VIEW COMPARISON:  None Available. FINDINGS: The heart size and mediastinal contours are within normal limits. Both lungs are clear. The visualized skeletal structures are unremarkable. IMPRESSION: No active disease. Electronically Signed   By: Helyn Numbers M.D.   On: 02/08/2023 04:04    Anti-infectives (From admission, onward)    Start     Dose/Rate Route Frequency Ordered Stop   02/08/23 0545  piperacillin-tazobactam (ZOSYN) IVPB 3.375 g        3.375 g 100 mL/hr over 30 Minutes Intravenous  Once 02/08/23 0540 02/08/23 0656       Assessment/Plan Acute Appendicitis History, exam and imaging consistent with acute appendicitis. No evidence of perforation or abscess on CT. Discussed operative vs non-operative intervention.  I have explained the procedure, risks, and aftercare of Laparoscopic Appendectomy.  Risks include but are not limited to anesthesia (MI, CVA, death, aspiration, prolonged intubation), bleeding, infection, wound problems, hernia, injury to surrounding structures (viscus, nerves, blood vessels, ureter), need for conversion to open procedure or ileocecectomy, post operative ileus or abscess, stump leak, stump appendicitis and increased risk of DVT/PE.  He seems to understand and agrees to proceed with surgery. Keep NPO. Start IV abx. Possible d/c from PACU. If requires admission postoperatively, will plan admission to observation.   FEN - NPO, IVF, PPI VTE - SCDs, plan to start lovenox if requires admission ID - Zosyn  Thickening of the EG junction - f/u GI outpatient. PPI here. Follows with [facility].  Patient  should f/u with PCP for other incidental findings including - Prostatomegaly , L femoral head changes (able to walk without pain, some clicking/catching at times), liver steatosis, and aortic atherosclerosis. These findings were discussed with patient. Follows with Dr. Gildardo Cranker.  Hx COPD - albuterol PRN HTN - PRN meds now. Will restart home meds if admitted  I reviewed nursing notes, last 24 h vitals and pain scores, last 48 h intake and output, last 24 h labs and trends, and last 24 h imaging results.   Jacinto Halim, [facility] 02/08/2023, 7:57 AM Please  see Amion for pager number during day hours 7:00am-4:30pm

## 2023-02-08 NOTE — Transfer of Care (Signed)
Immediate Anesthesia Transfer of Care Note  Patient: Thomas Kennedy  Procedure(s) Performed: APPENDECTOMY LAPAROSCOPIC (Abdomen)  Patient Location: PACU  Anesthesia Type:General  Level of Consciousness: awake, alert , and oriented  Airway & Oxygen Therapy: Patient Spontanous Breathing and Patient connected to nasal cannula oxygen  Post-op Assessment: Report given to RN, Post -op Vital signs reviewed and stable, and Patient moving all extremities X 4  Post vital signs: Reviewed and stable  Last Vitals:  Vitals Value Taken Time  BP 141/77 02/08/23 1151  Temp    Pulse 89 02/08/23 1153  Resp 14 02/08/23 1153  SpO2 97 % 02/08/23 1153  Vitals shown include unfiled device data.  Last Pain:  Vitals:   02/08/23 1151  TempSrc:   PainSc: 0-No pain         Complications:  Encounter Notable Events  Notable Event Outcome Phase Comment  Difficult to intubate - unexpected  Intraprocedure Filed from anesthesia note documentation.

## 2023-02-08 NOTE — Plan of Care (Signed)

## 2023-02-08 NOTE — Progress Notes (Signed)
Patient arrived to the room from PACU via Wheel chair. A/O x 4. No pain complained at this time. Abdomen incision  looks clean and intact. Friend is at the bedside. VSS.

## 2023-02-08 NOTE — ED Provider Notes (Signed)
[place] EMERGENCY DEPARTMENT AT [facility] Provider Note   CSN: [id number] Arrival date & time: 02/08/23  0129     History  Chief Complaint  Patient presents with   Abdominal Pain    Thomas Kennedy is a 62 y.o. male.  The history is provided by the patient and medical records.  Abdominal Pain Thomas Kennedy is a 62 y.o. male who presents to the Emergency Department complaining of abdominal pain.  Presents to the emergency department for evaluation of severe abdominal pain.  He states that on Saturday he felt a little bit unwell and attempted to go out to eat but after a few bites he had to leave and had numerous episodes of emesis.  Later in the day he developed epigastric pain.  The next day he felt weak and had some ongoing vomiting with severe worsening in his abdominal pain.  No associated fevers, chest pain, difficulty breathing.   Hx/o HTN  He does drink 3 to 4 glasses of alcohol daily.    Home Medications Prior to Admission medications   Medication Sig Start Date End Date Taking? Authorizing Provider  aspirin 325 MG tablet Take 325 mg by mouth every 6 (six) hours as needed for mild pain, moderate pain or headache.    [provider]  Cholecalciferol (VITAMIN D-3) 25 MCG (1000 UT) CAPS Take by mouth.    [provider]  Cyanocobalamin (VITAMIN B-12 PO) Take 1 tablet by mouth daily.    [provider]  irbesartan (AVAPRO) 150 MG tablet Take 150 mg by mouth daily. 08/03/16   [provider]  rosuvastatin (CRESTOR) 10 MG tablet TAKE 1 TABLET BY MOUTH EVERY DAY 11/26/22   Little Ishikawa, MD      Allergies    Ace inhibitors    Review of Systems   Review of Systems  Gastrointestinal:  Positive for abdominal pain.  All other systems reviewed and are negative.   Physical Exam Updated Vital Signs BP (!) 151/96   Pulse 79   Temp 98 F (36.7 C) (Oral)   Resp 16   Ht 5\' 10"  (1.778 m)   Wt 77.1 kg   SpO2 96%    BMI 24.39 kg/m  Physical Exam Vitals and nursing note reviewed.  Constitutional:      Appearance: He is well-developed.  HENT:     Head: Normocephalic and atraumatic.  Cardiovascular:     Rate and Rhythm: Normal rate and regular rhythm.  Pulmonary:     Effort: Pulmonary effort is normal. No respiratory distress.  Abdominal:     Palpations: Abdomen is soft.     Tenderness: There is guarding.     Comments: Moderate epigastric, left sided abdominal tenderness  Musculoskeletal:        General: No tenderness.  Skin:    General: Skin is warm and dry.  Neurological:     Mental Status: He is alert and oriented to person, place, and time.  Psychiatric:        Behavior: Behavior normal.     ED Results / Procedures / Treatments   Labs (all labs ordered are listed, but only abnormal results are displayed) Labs Reviewed  COMPREHENSIVE METABOLIC PANEL - Abnormal; Notable for the following components:      Result Value   Sodium 128 (*)    Potassium 3.3 (*)    Chloride 95 (*)    CO2 21 (*)    Glucose, Bld 143 (*)  AST 84 (*)    ALT 53 (*)    All other components within normal limits  CBC - Abnormal; Notable for the following components:   WBC 14.0 (*)    RBC 3.92 (*)    Hemoglobin 12.7 (*)    HCT 38.0 (*)    All other components within normal limits  URINALYSIS, ROUTINE W REFLEX MICROSCOPIC - Abnormal; Notable for the following components:   Bilirubin Urine SMALL (*)    Ketones, ur 20 (*)    Protein, ur 30 (*)    Bacteria, UA RARE (*)    All other components within normal limits  LIPASE, BLOOD    EKG None  Radiology CT ABDOMEN PELVIS W CONTRAST  Result Date: 02/08/2023 CLINICAL DATA:  Low abdominal pain.  Onset yesterday, with vomiting. EXAM: CT ABDOMEN AND PELVIS WITH CONTRAST TECHNIQUE: Multidetector CT imaging of the abdomen and pelvis was performed using the standard protocol following bolus administration of intravenous contrast. RADIATION DOSE REDUCTION: This  exam was performed according to the departmental dose-optimization program which includes automated exposure control, adjustment of the mA and/or kV according to patient size and/or use of iterative reconstruction technique. CONTRAST:  75mL OMNIPAQUE IOHEXOL 350 MG/ML SOLN COMPARISON:  Limited comparison available with low-dose lung cancer screening chest CT 03/17/2022. FINDINGS: Lower chest: Lung bases are clear with asymmetric elevation of the right hemidiaphragm chronically. There is increased mild-to-moderate thickening of the EG junction warranting endoscopic follow-up. The cardiac size is normal. Hepatobiliary: The liver is 20 cm length and mildly steatotic. There is no mass enhancement. The gallbladder and bile ducts are unremarkable. Pancreas: No abnormality. Spleen: No abnormality. Adrenals/Urinary Tract: Adrenal glands are unremarkable. Kidneys are normal, without renal calculi, focal lesion, or hydronephrosis. Bladder is unremarkable. Stomach/Bowel: Unremarkable stomach and unopacified small bowel. There is unruptured acute appendicitis. There are small stones in the proximal and distal appendiceal lumen with dilatation to 11 mm, inflammatory reaction and trace reactive fluid posterior to the appendix. No appendiceal rupture or abscess is seen. Fluid and positive contrast is noted in the ascending colon. There is descending and sigmoid diverticulosis without evidence of an acute diverticulitis. Vascular/Lymphatic: Aortic atherosclerosis. No enlarged abdominal or pelvic lymph nodes. Reproductive: Mild prostatomegaly with dystrophic central calcifications. Other: There is a tiny umbilical fat hernia. There is no incarcerated hernia. No free fluid apart from minimal nonlocalizing reactive periappendiceal fluid there is no free air, abscess or free hemorrhage. 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 No acute or other significant  osseous findings. There are healed fracture deformities of the posterior lower left ribs. IMPRESSION: 1. Acute unruptured appendicitis with the appendicolithiasis. 2. Mildly prominent liver with mild steatosis. 3. Increased thickening of the EG junction warranting endoscopic follow-up. 4. Aortic atherosclerosis. 5. 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 6. Prostatomegaly. 7. Critical Value/emergent results were called by telephone at the time of interpretation on 02/08/2023 at 5:40 am to provider [facility] , who verbally acknowledged these results. Aortic Atherosclerosis (ICD10-I70.0). Electronically Signed   By: Almira Bar M.D.   On: 02/08/2023 05:40   DG Chest Port 1 View  Result Date: 02/08/2023 CLINICAL DATA:  Vomiting EXAM: PORTABLE CHEST 1 VIEW COMPARISON:  None Available. FINDINGS: The heart size and mediastinal contours are within normal limits. Both lungs are clear. The visualized skeletal structures are unremarkable. IMPRESSION: No active disease. Electronically Signed   By: Helyn Numbers M.D.   On: 02/08/2023 04:04  Procedures Procedures    Medications Ordered in ED Medications  piperacillin-tazobactam (ZOSYN) IVPB 3.375 g (3.375 g Intravenous New Bag/Given 02/08/23 0611)  lactated ringers infusion ( Intravenous New Bag/Given 02/08/23 0608)  ondansetron (ZOFRAN-ODT) disintegrating tablet 4 mg (4 mg Oral Given 02/08/23 0208)  sodium chloride 0.9 % bolus 1,000 mL (0 mLs Intravenous Stopped 02/08/23 0601)  fentaNYL (SUBLIMAZE) injection 50 mcg (50 mcg Intravenous Given 02/08/23 0421)  iohexol (OMNIPAQUE) 350 MG/ML injection 75 mL (75 mLs Intravenous Contrast Given 02/08/23 0459)  morphine (PF) 4 MG/ML injection 4 mg (4 mg Intravenous Given 02/08/23 0601)    ED Course/ Medical Decision Making/ A&P                             Medical Decision Making Amount and/or Complexity of Data Reviewed Labs: ordered. Radiology: ordered.  Risk Prescription  drug management. Decision regarding hospitalization.   Patient with history of hypertension here for evaluation of abdominal pain, vomiting.  He has significant tenderness on examination.  Labs are significant for leukocytosis, hyponatremia.  Suspect he has a degree of dehydration.  He was treated with pain medications, IV fluids.  CT abdomen was obtained, which is consistent with acute appendicitis, no evidence of abscess or perforation.  Discussed these results with radiologist and personally reviewed the films.  Discussed with patient findings of studies.  He was started on antibiotics and treated with additional pain medications.  Discussed with Dr. Janee Morn with general surgery-will see the patient in consult.        Final Clinical Impression(s) / ED Diagnoses Final diagnoses:  Acute appendicitis with localized peritonitis, without perforation, abscess, or gangrene    Rx / DC Orders ED Discharge Orders     None         Tilden Fossa, MD 02/08/23 [phone]

## 2023-02-09 ENCOUNTER — Encounter (HOSPITAL_COMMUNITY): Payer: Self-pay | Admitting: General Surgery

## 2023-02-09 LAB — CBC
HCT: 34.3 % — ABNORMAL LOW (ref 39.0–52.0)
Hemoglobin: 11.6 g/dL — ABNORMAL LOW (ref 13.0–17.0)
MCH: 33.6 pg (ref 26.0–34.0)
MCHC: 33.8 g/dL (ref 30.0–36.0)
MCV: 99.4 fL (ref 80.0–100.0)
Platelets: 218 10*3/uL (ref 150–400)
RBC: 3.45 MIL/uL — ABNORMAL LOW (ref 4.22–5.81)
RDW: 12.5 % (ref 11.5–15.5)
WBC: 13.6 10*3/uL — ABNORMAL HIGH (ref 4.0–10.5)
nRBC: 0 % (ref 0.0–0.2)

## 2023-02-09 LAB — SURGICAL PATHOLOGY

## 2023-02-09 LAB — BASIC METABOLIC PANEL
Anion gap: 11 (ref 5–15)
BUN: 9 mg/dL (ref 6–20)
CO2: 21 mmol/L — ABNORMAL LOW (ref 22–32)
Calcium: 8.7 mg/dL — ABNORMAL LOW (ref 8.9–10.3)
Chloride: 105 mmol/L (ref 98–111)
Creatinine, Ser: 0.85 mg/dL (ref 0.61–1.24)
GFR, Estimated: >60 mL/min (ref >60)
Glucose, Bld: 132 mg/dL — ABNORMAL HIGH (ref 70–99)
Potassium: 3.6 mmol/L (ref 3.5–5.1)
Sodium: 137 mmol/L (ref 135–145)

## 2023-02-09 MED ORDER — ACETAMINOPHEN 500 MG PO TABS: 1000.0000 mg | ORAL_TABLET | Freq: Three times a day (TID) | ORAL | Status: DC | PRN

## 2023-02-09 MED ORDER — METHOCARBAMOL 500 MG PO TABS: 500.0000 mg | ORAL_TABLET | Freq: Three times a day (TID) | ORAL | 0 refills | Status: DC | PRN

## 2023-02-09 MED ORDER — ADULT MULTIVITAMIN W/MINERALS CH: 1.0000 | ORAL_TABLET | Freq: Every day | ORAL | Status: AC

## 2023-02-09 MED ORDER — PANTOPRAZOLE SODIUM 40 MG PO TBEC: 40.0000 mg | DELAYED_RELEASE_TABLET | Freq: Every day | ORAL | Status: DC

## 2023-02-09 MED ORDER — VITAMIN B-1 100 MG PO TABS: 100.0000 mg | ORAL_TABLET | Freq: Every day | ORAL | Status: DC

## 2023-02-09 MED ORDER — POLYETHYLENE GLYCOL 3350 17 G PO PACK: 17.0000 g | PACK | Freq: Every day | ORAL | Status: DC | PRN

## 2023-02-09 MED ORDER — OXYCODONE HCL 5 MG PO TABS: 5.0000 mg | ORAL_TABLET | Freq: Four times a day (QID) | ORAL | 0 refills | Status: DC | PRN

## 2023-02-09 MED ORDER — FOLIC ACID 1 MG PO TABS: 1.0000 mg | ORAL_TABLET | Freq: Every day | ORAL | Status: DC

## 2023-02-09 NOTE — Discharge Summary (Addendum)
Patient ID: Thomas Kennedy [facility] [id number] November 14, 1960 61 y.o.  Admit date: 02/08/2023 Discharge date: 02/09/2023   Discharge Diagnosis Acute appendicitis s/p Laparoscopic Appendectomy - 02/08/23  Consultants None  Reason for Admission: Thomas Kennedy is a 62 y.o. male with a hx of HTN, COPD and reported HLD who presented with abdominal pain. Reports on 7/13 he began having epigastric abdominal pain, n/v. Pain still in his epigastrium but has since moved to his lower abdomen RLQ>LLQ. Associated chills and diarrhea x 1 on 7/13. No flatus or bm since that time. Denies fever or urinary symptoms.    Afebrile. No tachycardia or hypotension. WBC 14. CT w/ acute unruptured appendicitis with the appendicolithiasis.   Procedures Dr. Carolynne Edouard - Laparoscopic Appendectomy - 02/08/23  Hospital Course:  The patient was admitted and underwent a laparoscopic appendectomy.  The patient tolerated the procedure well.  On POD 1, the patient was tolerating a regular diet, voiding well, mobilizing, and pain was controlled with oral pain medications.  The patient was stable for DC home at this time with appropriate follow up made. Discussed discharge instructions, restrictions and return/call back precautions. Patient knows to f/u with GI/PCP for incidental findings on CT.     Physical Exam: Gen:  Alert, NAD, pleasant Card:  RRR Pulm:  CTAB, no W/R/R, effort normal Abd: Soft, no distension, appropriately tender around laparoscopic incisions, no rigidity or guarding and otherwise NT, +BS. Incisions with glue intact appears well and are without drainage, bleeding, or signs of infection  Psych: A&Ox3    Allergies as of 02/09/2023       Reactions   Ace Inhibitors Cough        Medication List     TAKE these medications    acetaminophen 500 MG tablet Commonly known as: TYLENOL Take 2 tablets (1,000 mg total) by mouth every 8 (eight) hours as needed.   folic acid 1 MG tablet Commonly known as:  FOLVITE Take 1 tablet (1 mg total) by mouth daily. Start taking on: February 10, 2023   irbesartan 150 MG tablet Commonly known as: AVAPRO Take 150 mg by mouth daily.   methocarbamol 500 MG tablet Commonly known as: ROBAXIN Take 1 tablet (500 mg total) by mouth every 8 (eight) hours as needed for muscle spasms.   metroNIDAZOLE 0.75 % cream Commonly known as: METROCREAM Apply 1 Application topically daily as needed (Rosacea).   multivitamin with minerals Tabs tablet Take 1 tablet by mouth daily. Start taking on: February 10, 2023   oxyCODONE 5 MG immediate release tablet Commonly known as: Oxy IR/ROXICODONE Take 1 tablet (5 mg total) by mouth every 6 (six) hours as needed for breakthrough pain.   pantoprazole 40 MG tablet Commonly known as: Protonix Take 1 tablet (40 mg total) by mouth daily.   polyethylene glycol 17 g packet Commonly known as: MIRALAX / GLYCOLAX Take 17 g by mouth daily as needed for mild constipation.   rosuvastatin 10 MG tablet Commonly known as: CRESTOR TAKE 1 TABLET BY MOUTH EVERY DAY   thiamine 100 MG tablet Commonly known as: Vitamin B-1 Take 1 tablet (100 mg total) by mouth daily. Start taking on: February 10, 2023   Vitamin D-3 25 MCG (1000 UT) Caps Take 3,000 Units by mouth daily.          Follow-up Information     Mathilda Maguire, Hedda Slade, [place]. Go on 03/02/2023.   Specialty: General Surgery Why: 03/02/23 at 9:320 am. Please bring a copy of your photo  ID, insurance card, and arrive 30 minutes prior to your appointment Contact information: [address] [place] [address] [phone]         Daisy Floro, MD Follow up.   Specialty: Family Medicine Contact information: [address] [place] [address] [phone]         Gastroenterology, Deboraha Sprang Follow up.   Contact information: [address] [place] [address] [phone]                 Signed: Leary Roca, [facility] Surgery 02/09/2023, 9:28 AM Please see Amion for pager number during day hours 7:00am-4:30pm

## 2023-02-09 NOTE — Progress Notes (Signed)
Transition of Care [facility]) - Inpatient Brief Assessment   Patient Details  Name: Thomas Kennedy MRN: [id number] Date of Birth: 06/09/1961  Transition [facility]) CM/SW Contact:    Janae Bridgeman, RN Phone Number: 02/09/2023, 9:40 AM   Clinical Narrative: Patient will discharge today.  Substance abuse OP counseling resources provided in the AVS for patient to follow up, considering patient consumes 4-5 ETOH drinks per day as noted upon admission to the hospital.   Transition of Care Asessment: Insurance and Status: (P) Insurance coverage has been reviewed Patient has primary care physician: (P) Yes Home environment has been reviewed: (P) Yes Prior level of function:: (P) Independent Prior/Current Home Services: (P) No current home services Social Determinants of Health Reivew: (P) SDOH reviewed interventions complete Readmission risk has been reviewed: (P) Yes Transition of care needs: (P) transition of care needs identified, TOC will continue to follow

## 2023-02-28 DIAGNOSIS — Z9049 Acquired absence of other specified parts of digestive tract: Secondary | ICD-10-CM | POA: Diagnosis not present

## 2023-02-28 DIAGNOSIS — K5730 Diverticulosis of large intestine without perforation or abscess without bleeding: Secondary | ICD-10-CM | POA: Diagnosis not present

## 2023-02-28 DIAGNOSIS — I10 Essential (primary) hypertension: Secondary | ICD-10-CM | POA: Diagnosis not present

## 2023-02-28 DIAGNOSIS — R109 Unspecified abdominal pain: Secondary | ICD-10-CM | POA: Diagnosis not present

## 2023-02-28 DIAGNOSIS — R1033 Periumbilical pain: Secondary | ICD-10-CM | POA: Diagnosis not present

## 2023-02-28 DIAGNOSIS — G8918 Other acute postprocedural pain: Secondary | ICD-10-CM | POA: Diagnosis not present

## 2023-02-28 DIAGNOSIS — Z87891 Personal history of nicotine dependence: Secondary | ICD-10-CM | POA: Diagnosis not present

## 2023-03-05 ENCOUNTER — Other Ambulatory Visit: Payer: Self-pay | Admitting: Cardiology

## 2023-03-10 ENCOUNTER — Telehealth: Payer: Self-pay

## 2023-03-10 NOTE — Telephone Encounter (Signed)
   Name: Thomas Kennedy [id number]  DOB: [birth year]  MRN: [id number]  Primary Cardiologist: Little Ishikawa, MD  Chart reviewed as part of pre-operative protocol coverage. Because of Thomas Kennedy's past medical history and time since last visit, he will require a follow-up in-office visit in order to better assess preoperative cardiovascular risk.  Pre-op covering staff: - Please schedule appointment and call patient to inform them. If patient already had an upcoming appointment within acceptable timeframe, please add "pre-op clearance" to the appointment notes so provider is aware. - Please contact requesting surgeon's office via preferred method (i.e, phone, fax) to inform them of need for appointment prior to surgery.  No medications indicated as needing held.  Sharlene Dory, PA-C  03/10/2023, 11:03 AM

## 2023-03-10 NOTE — Telephone Encounter (Signed)
1st attempt to reach pt regarding surgical clearance and the need for an IN OFFICE appointment.  Unable to LVM as MB is full.

## 2023-03-10 NOTE — Telephone Encounter (Signed)
   Pre-operative Risk Assessment    Patient Name: Thomas Kennedy  DOB: June 12, 1961 MRN: [id number]      Request for Surgical Clearance    Procedure:   Endoscopy  Date of Surgery:  Clearance 03/24/23                                 Surgeon:  Dr. Kerin Salen Surgeon's Group or Practice Name:  [facility] Phone number:  [phone] Fax number:  [phone]   Type of Clearance Requested:   - Medical    Type of Anesthesia:  General    Additional requests/questions:    Garrel Ridgel   03/10/2023, 10:47 AM

## 2023-03-15 NOTE — Telephone Encounter (Signed)
Pt scheduled to see Bjorn Pippin 03/17/23 @ 11:00am

## 2023-03-15 NOTE — Telephone Encounter (Signed)
2nd attempt at scheduling an in-office visit for preop clearance.

## 2023-03-16 ENCOUNTER — Telehealth: Payer: Self-pay | Admitting: Acute Care

## 2023-03-16 ENCOUNTER — Other Ambulatory Visit: Payer: Self-pay | Admitting: Cardiology

## 2023-03-16 NOTE — Telephone Encounter (Signed)
Patient is calling trying to reschedule his CT scan. He is unsure if it will still be covered by his insurance. Please call and advise. He can be reached at [phone]

## 2023-03-17 ENCOUNTER — Ambulatory Visit: Payer: BC Managed Care – PPO | Attending: Cardiology | Admitting: Cardiology

## 2023-03-17 ENCOUNTER — Ambulatory Visit: Payer: BC Managed Care – PPO | Attending: Cardiology

## 2023-03-17 ENCOUNTER — Other Ambulatory Visit: Payer: Self-pay | Admitting: Cardiology

## 2023-03-17 VITALS — BP 112/74 | HR 77 | Ht 70.0 in | Wt 168.0 lb

## 2023-03-17 DIAGNOSIS — I77819 Aortic ectasia, unspecified site: Secondary | ICD-10-CM

## 2023-03-17 DIAGNOSIS — E785 Hyperlipidemia, unspecified: Secondary | ICD-10-CM

## 2023-03-17 DIAGNOSIS — Z01818 Encounter for other preprocedural examination: Secondary | ICD-10-CM

## 2023-03-17 DIAGNOSIS — R55 Syncope and collapse: Secondary | ICD-10-CM | POA: Diagnosis not present

## 2023-03-17 DIAGNOSIS — I10 Essential (primary) hypertension: Secondary | ICD-10-CM

## 2023-03-17 MED ORDER — ROSUVASTATIN CALCIUM 20 MG PO TABS
20.0000 mg | ORAL_TABLET | Freq: Every day | ORAL | 3 refills | Status: DC
Start: 2023-03-17 — End: 2024-03-22

## 2023-03-17 NOTE — Progress Notes (Signed)
Cardiology Office Note:    Date:  03/17/2023   ID:  BRANDI HERNANDEZGARCI, DOB 16-Jul-1961, MRN [id number]  PCP:  Daisy Floro, MD  Cardiologist:  Little Ishikawa, MD  Electrophysiologist:  None   Referring MD: Daisy Floro, MD   Chief Complaint  Patient presents with   Pre-op Exam     History of Present Illness:    Thomas Kennedy is a 62 y.o. male with tobacco use, hypertension who presents for follow-up.  He was referred by Dr. Tenny Craw for evaluation of coronary calcifications on CT chest, initially seen 01/2021.  He states he has hypertension. He says when he takes his irbesartan 150 MG  his systolic blood pressure ranges 130s-135 and diastolic 80s-85s. He experiences occasional shortness of breath after showering, hot flashes, and palpitations that comes once a week and lasts about 5-10 minutes. He feels his heart racing when he has these episodes. He also has occasional lightheadedness. He includes he had a presyncopal episode when he went to the grocery store and had to hold on to the shelf for support. Denies syncope, LE edema or chest pains. He use to exercise regularly for about 30-40 minutes with no complications however has stopped 5 months ago. His father died from bladder cancer. He notes his father was not taking medication. His grandmother died from a massive MI. He smoked one pack of cigarettes a day for 35 years however has stopped smoking 8 years ago.   Calcium score 03/06/2021 was 51 (63rd percentile), dilated ascending aorta measuring 41 mm.  Echocardiogram 03/06/2021 showed normal biventricular function, no significant valvular disease.  Zio patch x 14 days 02/2021 showed no significant arrhythmias.  Since last clinic visit, he reports he is doing okay.  In July he was admitted with acute appendicitis, underwent appendectomy.  He was noted on CT abdomen pelvis to have increased thickening of GE junction, EGD is planned for next week.  Does report he had syncopal  episode a few months ago.  States that he went out to mow his yard, came inside and was filling up a glass of water when he suddenly went to the ground.  Denies any warning symptoms.  Denies any chest pain, dyspnea, lower extremity edema, or palpitations.  Reports he can walk up 2 flights of stairs without stopping, denies any exertional chest pain.  Past Medical History:  Diagnosis Date   COPD (chronic obstructive pulmonary disease) (HCC)    Hypertension     Past Surgical History:  Procedure Laterality Date   EYE SURGERY     LAPAROSCOPIC APPENDECTOMY N/A 02/08/2023   Procedure: APPENDECTOMY LAPAROSCOPIC;  Surgeon: Griselda Miner, MD;  Location: [facility] OR;  Service: General;  Laterality: N/A;    Current Medications: Current Meds  Medication Sig   Cholecalciferol (VITAMIN D-3) 25 MCG (1000 UT) CAPS Take 3,000 Units by mouth daily.   folic acid (FOLVITE) 1 MG tablet Take 1 tablet (1 mg total) by mouth daily.   irbesartan (AVAPRO) 150 MG tablet Take 150 mg by mouth daily.   metroNIDAZOLE (METROCREAM) 0.75 % cream Apply 1 Application topically daily as needed (Rosacea).   Multiple Vitamin (MULTIVITAMIN WITH MINERALS) TABS tablet Take 1 tablet by mouth daily.   oxyCODONE (OXY IR/ROXICODONE) 5 MG immediate release tablet Take 1 tablet (5 mg total) by mouth every 6 (six) hours as needed for breakthrough pain.   rosuvastatin (CRESTOR) 20 MG tablet Take 1 tablet (20 mg total) by mouth daily.   thiamine (  VITAMIN B-1) 100 MG tablet Take 1 tablet (100 mg total) by mouth daily.   [DISCONTINUED] rosuvastatin (CRESTOR) 10 MG tablet TAKE 1 TABLET BY MOUTH EVERY DAY     Allergies:   Ace inhibitors   Social History   Socioeconomic History   Marital status: Single    Spouse name: Not on file   Number of children: Not on file   Years of education: Not on file   Highest education level: Not on file  Occupational History   Not on file  Tobacco Use   Smoking status: Former    Current packs/day: 0.00     Types: Cigarettes    Quit date: 2014    Years since quitting: 10.6   Smokeless tobacco: Former  Substance and Sexual Activity   Alcohol use: Yes    Alcohol/week: 7.0 standard drinks of alcohol    Types: 7 Standard drinks or equivalent per week   Drug use: Never   Sexual activity: Not on file  Other Topics Concern   Not on file  Social History Narrative   Not on file   Social Determinants of Health   Financial Resource Strain: Not on file  Food Insecurity: No Food Insecurity (02/08/2023)   Hunger Vital Sign    Worried About Running Out of Food in the Last Year: Never true    Ran Out of Food in the Last Year: Never true  Transportation Needs: No Transportation Needs (02/08/2023)   PRAPARE - Administrator, Civil Service (Medical): No    Lack of Transportation (Non-Medical): No  Physical Activity: Not on file  Stress: Not on file  Social Connections: Not on file     Family History: The patient's family history is not on file.  ROS:   Please see the history of present illness.      All other systems reviewed and are negative.  EKGs/Labs/Other Studies Reviewed:    No prior CV studies available.  EKG:   07/22: sinus rhythm, rate 67, no ST abnormalities, Q-wave in V1 and V2  03/17/23: Normal sinus rhythm, rate 77, no ST abnormalities, Q-wave in V1/2  Recent Labs: 02/08/2023: ALT 53 02/09/2023: BUN 9; Creatinine, Ser 0.85; Hemoglobin 11.6; Platelets 218; Potassium 3.6; Sodium 137  Recent Lipid Panel No results found for: "CHOL", "TRIG", "HDL", "CHOLHDL", "VLDL", "LDLCALC", "LDLDIRECT"  Physical Exam:    VS:  BP 112/74   Pulse 77   Ht 5\' 10"  (1.778 m)   Wt 168 lb (76.2 kg)   SpO2 98%   BMI 24.11 kg/m     Wt Readings from Last 3 Encounters:  03/17/23 168 lb (76.2 kg)  02/09/23 173 lb 8 oz (78.7 kg)  03/12/22 166 lb 6.4 oz (75.5 kg)     GEN:  Well nourished, well developed in no acute distress HEENT: Normal NECK: No JVD; No carotid  bruits LYMPHATICS: No lymphadenopathy CARDIAC: RRR, no murmurs, rubs, gallops RESPIRATORY:  Clear to auscultation without rales, wheezing or rhonchi  ABDOMEN: Soft, non-tender, non-distended MUSCULOSKELETAL:  No edema; No deformity  SKIN: Warm and dry NEUROLOGIC:  Alert and oriented x 3 PSYCHIATRIC:  Normal affect   ASSESSMENT:    1. Pre-operative clearance   2. Syncope and collapse   3. Hyperlipidemia, unspecified hyperlipidemia type   4. Aortic dilatation (HCC)   5. Essential hypertension     PLAN:    Preop evaluation: Prior to EGD.  Good functional capacity, greater than 4 METS, denies any exertional symptoms.  Did  report recent syncopal episode.  Recommend echocardiogram, will schedule prior to procedure.  Syncope: Concerning for arrhythmia given lack of prodromal symptoms.  Check echocardiogram to evaluate for structural heart disease.  Check Zio patch x 2 weeks  Dyspnea: Reports intermittent dyspnea, EKG with Q waves in V1/2. Echocardiogram 03/06/2021 showed normal biventricular function, no significant valvular disease.    Hypertension: on irbesartan 150 mg daily.  Appears controlled.    Hyperlipidemia: LDL 132 on 12/12/2020.  Calcium score 03/06/2021 was 51 (63rd percentile).  Started on rosuvastatin 10 mg daily, LDL 87 on 01/21/2023.  Increase rosuvastatin to 20 mg daily for goal LDL less than 70  Aortic dilatation: Ascending aorta measured 41mm on calcium score 02/2021, stable measuring 40mm on CT chest 02/2022.  Will follow with echocardiogram as above  Palpitations: Zio patch x 14 days 02/2021 showed no significant arrhythmias.  Reports no recent palpitations  RTC in 6 months   Medication Adjustments/Labs and Tests Ordered: Current medicines are reviewed at length with the patient today.  Concerns regarding medicines are outlined above.  Orders Placed This Encounter  Procedures   Lipid panel   LONG TERM MONITOR (3-14 DAYS)   EKG 12-Lead    Meds ordered this  encounter  Medications   rosuvastatin (CRESTOR) 20 MG tablet    Sig: Take 1 tablet (20 mg total) by mouth daily.    Dispense:  90 tablet    Refill:  3     Patient Instructions  Medication Instructions:  Your physician has recommended you make the following change in your medication:   - Crestor 20mg , once daily   *If you need a refill on your cardiac medications before your next appointment, please call your pharmacy*   Lab Work:  If you have labs (blood work) drawn today and your tests are completely normal, you will receive your results only by: MyChart Message (if you have MyChart) OR A paper copy in the mail If you have any lab test that is abnormal or we need to change your treatment, we will call you to review the results.   Testing/Procedures:  Echo will be scheduled at [address].  Your physician has requested that you have an echocardiogram. Echocardiography is a painless test that uses sound waves to create images of your heart. It provides your doctor with information about the size and shape of your heart and how well your heart's chambers and valves are working. This procedure takes approximately one hour. There are no restrictions for this procedure. Please do NOT wear cologne, perfume, aftershave, or lotions (deodorant is allowed). Please arrive 15 minutes prior to your appointment time.    Your physician recommends that you return for lab work in: 2 months for   - Fasting Lipid Panel    Follow-Up: At [facility], you and your health needs are our priority.  As part of our continuing mission to provide you with exceptional heart care, we have created designated Provider Care Teams.  These Care Teams include your primary Cardiologist (physician) and Advanced Practice Providers (APPs -  Physician Assistants and Nurse Practitioners) who all work together to provide you with the care you need, when you need it.  We recommend signing up  for the patient portal called "MyChart".  Sign up information is provided on this After Visit Summary.  MyChart is used to connect with patients for Virtual Visits (Telemedicine).  Patients are able to view lab/test results, encounter notes, upcoming appointments, etc.  Non-urgent messages can be sent to your provider as well.   To learn more about what you can do with MyChart, go to [email or web address].    Your next appointment:   3 month(s)  Provider:   Little Ishikawa, MD     Other Instructions Christena Deem- Long Term Monitor Instructions  Your physician has requested you wear a ZIO patch monitor for 14 days.  This is a single patch monitor. [employer] supplies one patch monitor per enrollment. Additional stickers are not available. Please do not apply patch if you will be having a Nuclear Stress Test,  Echocardiogram, Cardiac CT, MRI, or Chest Xray during the period you would be wearing the  monitor. The patch cannot be worn during these tests. You cannot remove and re-apply the  ZIO XT patch monitor.  Your ZIO patch monitor will be mailed 3 day USPS to your address on file. It may take 3-5 days  to receive your monitor after you have been enrolled.  Once you have received your monitor, please review the enclosed instructions. Your monitor  has already been registered assigning a specific monitor serial # to you.  Billing and Patient Assistance Program Information  We have supplied [employer] with any of your insurance information on file for billing purposes. [employer] offers a sliding scale Patient Assistance Program for patients that do not have  insurance, or whose insurance does not completely cover the cost of the ZIO monitor.  You must apply for the Patient Assistance Program to qualify for this discounted rate.  To apply, please call [employer] at [phone], select option 4, select option 2, ask to apply for  Patient Assistance Program. Meredeth Ide will ask your household income,  and how many people  are in your household. They will quote your out-of-pocket cost based on that information.  [employer] will also be able to set up a 100-month, interest-free payment plan if needed.  Applying the monitor   Shave hair from upper left chest.  Hold abrader disc by orange tab. Rub abrader in 40 strokes over the upper left chest as  indicated in your monitor instructions.  Clean area with 4 enclosed alcohol pads. Let dry.  Apply patch as indicated in monitor instructions. Patch will be placed under collarbone on left  side of chest with arrow pointing upward.  Rub patch adhesive wings for 2 minutes. Remove white label marked "1". Remove the white  label marked "2". Rub patch adhesive wings for 2 additional minutes.  While looking in a mirror, press and release button in center of patch. A small green light will  flash 3-4 times. This will be your only indicator that the monitor has been turned on.  Do not shower for the first 24 hours. You may shower after the first 24 hours.  Press the button if you feel a symptom. You will hear a small click. Record Date, Time and  Symptom in the Patient Logbook.  When you are ready to remove the patch, follow instructions on the last 2 pages of Patient  Logbook. Stick patch monitor onto the last page of Patient Logbook.  Place Patient Logbook in the blue and white box. Use locking tab on box and tape box closed  securely. The blue and white box has prepaid postage on it. Please place it in the mailbox as  soon as possible. Your physician should have your test results approximately 7 days after the  monitor has been mailed back to [facility].  Call [employer]  Customer Care at [phone] if you have questions regarding  your ZIO XT patch monitor. Call them immediately if you see an orange light blinking on your  monitor.  If your monitor falls off in less than 4 days, contact our Monitor department at [phone].  If your monitor  becomes loose or falls off after 4 days call [employer] at [phone] for  suggestions on securing your monitor        Signed, Little Ishikawa, MD  03/17/2023 12:57 PM    [facility]

## 2023-03-17 NOTE — Progress Notes (Unsigned)
Enrolled for [employer] to mail a ZIO XT long term holter monitor to the patients address on file.  

## 2023-03-17 NOTE — Patient Instructions (Signed)
Medication Instructions:  Your physician has recommended you make the following change in your medication:   - Crestor 20mg , once daily   *If you need a refill on your cardiac medications before your next appointment, please call your pharmacy*   Lab Work:  If you have labs (blood work) drawn today and your tests are completely normal, you will receive your results only by: MyChart Message (if you have MyChart) OR A paper copy in the mail If you have any lab test that is abnormal or we need to change your treatment, we will call you to review the results.   Testing/Procedures:  Echo will be scheduled at [address].  Your physician has requested that you have an echocardiogram. Echocardiography is a painless test that uses sound waves to create images of your heart. It provides your doctor with information about the size and shape of your heart and how well your heart's chambers and valves are working. This procedure takes approximately one hour. There are no restrictions for this procedure. Please do NOT wear cologne, perfume, aftershave, or lotions (deodorant is allowed). Please arrive 15 minutes prior to your appointment time.    Your physician recommends that you return for lab work in: 2 months for   - Fasting Lipid Panel    Follow-Up: At [facility], you and your health needs are our priority.  As part of our continuing mission to provide you with exceptional heart care, we have created designated Provider Care Teams.  These Care Teams include your primary Cardiologist (physician) and Advanced Practice Providers (APPs -  Physician Assistants and Nurse Practitioners) who all work together to provide you with the care you need, when you need it.  We recommend signing up for the patient portal called "MyChart".  Sign up information is provided on this After Visit Summary.  MyChart is used to connect with patients for Virtual Visits (Telemedicine).  Patients  are able to view lab/test results, encounter notes, upcoming appointments, etc.  Non-urgent messages can be sent to your provider as well.   To learn more about what you can do with MyChart, go to [email or web address].    Your next appointment:   3 month(s)  Provider:   Little Ishikawa, MD     Other Instructions Christena Deem- Long Term Monitor Instructions  Your physician has requested you wear a ZIO patch monitor for 14 days.  This is a single patch monitor. [employer] supplies one patch monitor per enrollment. Additional stickers are not available. Please do not apply patch if you will be having a Nuclear Stress Test,  Echocardiogram, Cardiac CT, MRI, or Chest Xray during the period you would be wearing the  monitor. The patch cannot be worn during these tests. You cannot remove and re-apply the  ZIO XT patch monitor.  Your ZIO patch monitor will be mailed 3 day USPS to your address on file. It may take 3-5 days  to receive your monitor after you have been enrolled.  Once you have received your monitor, please review the enclosed instructions. Your monitor  has already been registered assigning a specific monitor serial # to you.  Billing and Patient Assistance Program Information  We have supplied [employer] with any of your insurance information on file for billing purposes. [employer] offers a sliding scale Patient Assistance Program for patients that do not have  insurance, or whose insurance does not completely cover the cost of the ZIO monitor.  You must apply  for the Patient Assistance Program to qualify for this discounted rate.  To apply, please call [employer] at [phone], select option 4, select option 2, ask to apply for  Patient Assistance Program. Meredeth Ide will ask your household income, and how many people  are in your household. They will quote your out-of-pocket cost based on that information.  [employer] will also be able to set up a 51-month, interest-free payment plan if  needed.  Applying the monitor   Shave hair from upper left chest.  Hold abrader disc by orange tab. Rub abrader in 40 strokes over the upper left chest as  indicated in your monitor instructions.  Clean area with 4 enclosed alcohol pads. Let dry.  Apply patch as indicated in monitor instructions. Patch will be placed under collarbone on left  side of chest with arrow pointing upward.  Rub patch adhesive wings for 2 minutes. Remove white label marked "1". Remove the white  label marked "2". Rub patch adhesive wings for 2 additional minutes.  While looking in a mirror, press and release button in center of patch. A small green light will  flash 3-4 times. This will be your only indicator that the monitor has been turned on.  Do not shower for the first 24 hours. You may shower after the first 24 hours.  Press the button if you feel a symptom. You will hear a small click. Record Date, Time and  Symptom in the Patient Logbook.  When you are ready to remove the patch, follow instructions on the last 2 pages of Patient  Logbook. Stick patch monitor onto the last page of Patient Logbook.  Place Patient Logbook in the blue and white box. Use locking tab on box and tape box closed  securely. The blue and white box has prepaid postage on it. Please place it in the mailbox as  soon as possible. Your physician should have your test results approximately 7 days after the  monitor has been mailed back to [facility].  Call [facility] Customer Care at [phone] if you have questions regarding  your ZIO XT patch monitor. Call them immediately if you see an orange light blinking on your  monitor.  If your monitor falls off in less than 4 days, contact our Monitor department at [phone].  If your monitor becomes loose or falls off after 4 days call [employer] at [phone] for  suggestions on securing your monitor

## 2023-03-18 ENCOUNTER — Other Ambulatory Visit: Payer: Self-pay

## 2023-03-18 DIAGNOSIS — Z122 Encounter for screening for malignant neoplasm of respiratory organs: Secondary | ICD-10-CM

## 2023-03-18 DIAGNOSIS — Z87891 Personal history of nicotine dependence: Secondary | ICD-10-CM

## 2023-03-18 NOTE — Telephone Encounter (Signed)
Lm/am for patient to call back

## 2023-03-19 ENCOUNTER — Other Ambulatory Visit: Payer: BC Managed Care – PPO

## 2023-03-22 ENCOUNTER — Ambulatory Visit: Payer: BC Managed Care – PPO | Attending: Cardiology

## 2023-03-22 DIAGNOSIS — R55 Syncope and collapse: Secondary | ICD-10-CM | POA: Diagnosis not present

## 2023-03-22 DIAGNOSIS — Z01810 Encounter for preprocedural cardiovascular examination: Secondary | ICD-10-CM

## 2023-03-22 DIAGNOSIS — Z01818 Encounter for other preprocedural examination: Secondary | ICD-10-CM

## 2023-03-22 LAB — ECHOCARDIOGRAM COMPLETE
Area-P 1/2: 3.91 cm2
S' Lateral: 2.1 cm

## 2023-03-22 NOTE — Telephone Encounter (Signed)
Patient has resc his appt

## 2023-03-23 ENCOUNTER — Other Ambulatory Visit: Payer: Self-pay

## 2023-03-23 DIAGNOSIS — R55 Syncope and collapse: Secondary | ICD-10-CM

## 2023-03-26 DIAGNOSIS — R55 Syncope and collapse: Secondary | ICD-10-CM

## 2023-04-06 ENCOUNTER — Telehealth: Payer: Self-pay | Admitting: Cardiology

## 2023-04-06 ENCOUNTER — Ambulatory Visit
Admission: RE | Admit: 2023-04-06 | Discharge: 2023-04-06 | Disposition: A | Discharge status: Home / Self Care | Payer: BC Managed Care – PPO | Source: Ambulatory Visit | Attending: Family Medicine | Admitting: Family Medicine

## 2023-04-06 DIAGNOSIS — Z87891 Personal history of nicotine dependence: Secondary | ICD-10-CM | POA: Diagnosis not present

## 2023-04-06 DIAGNOSIS — Z122 Encounter for screening for malignant neoplasm of respiratory organs: Secondary | ICD-10-CM

## 2023-04-06 NOTE — Telephone Encounter (Signed)
Patient stated he had to do a GI CT LCS 20 MIN test today and had to remove his Zio monitor for about 5 minutes and wants to know if this would be OK .  Patient noted his Zio monitor is due to end Friday, 9/13.

## 2023-04-06 NOTE — Telephone Encounter (Signed)
Patient has had monitor on for approx 1 1/2 weeks. He states removing for 20 minutes for a test. He states he placed it back on after the test.  Advised to call company and make sure it is still transmitting and if so to continue to wear it until Friday.  If for any reason it stopped, then he can return it.  He will call them at lunch

## 2023-04-07 ENCOUNTER — Telehealth: Payer: Self-pay | Admitting: *Deleted

## 2023-04-07 NOTE — Telephone Encounter (Signed)
   Pre-operative Risk Assessment    Patient Name: Thomas Kennedy  DOB: [birth year] MRN: [id number]   DATE OF LAST VISIT:  03/17/23 DR. SCHUMANN DATE OF NEXT VISIT: 06/08/23 [facility]  Request for Surgical Clearance    Procedure:   ENDOSCOPY ; INCREASED THICKENING OF THE EG JUNCTION  Date of Surgery:  Clearance 04/21/23                                 Surgeon:  DR. [facility] Surgeon's Group or Practice Name:  [facility] Phone number:  [phone] Fax number:  [phone]   Type of Clearance Requested:   - Medical ; NO MEDICATIONS LISTED AS NEEDING TO BE HELD   Type of Anesthesia:   PROPOFOL   Additional requests/questions:    Elpidio Anis   04/07/2023, 5:11 PM

## 2023-04-08 NOTE — Telephone Encounter (Signed)
   Patient Name: Thomas Kennedy [id number]  DOB: [birth year] MRN: [id number]  Primary Cardiologist: Little Ishikawa, MD  Chart reviewed as part of pre-operative protocol coverage. Given past medical history and time since last visit, based on ACC/AHA guidelines, ELUTERIO NICOLAOU is at acceptable risk for the planned procedure without further cardiovascular testing.   Pt was last seen in the office on 03/17/2023 by Dr. Bjorn Pippin. Per result note by Dr. Bjorn Pippin from recent echocardiogram, "Dilated aortic root, otherwise no significant abnormalities.  No further workup recommended prior to his upcoming procedure.  Recommend CTA chest in 1 year to follow-up dilated aorta."  I will route this recommendation as well as the note from most recent OV with Dr. Bjorn Pippin to the requesting party via Epic fax function and remove from pre-op pool.  Please call with questions.  Joylene Grapes, NP 04/08/2023, 8:03 AM

## 2023-04-16 DIAGNOSIS — R55 Syncope and collapse: Secondary | ICD-10-CM | POA: Diagnosis not present

## 2023-04-19 ENCOUNTER — Other Ambulatory Visit: Payer: Self-pay

## 2023-04-19 DIAGNOSIS — Z87891 Personal history of nicotine dependence: Secondary | ICD-10-CM

## 2023-04-19 DIAGNOSIS — Z122 Encounter for screening for malignant neoplasm of respiratory organs: Secondary | ICD-10-CM

## 2023-04-21 DIAGNOSIS — R933 Abnormal findings on diagnostic imaging of other parts of digestive tract: Secondary | ICD-10-CM | POA: Diagnosis not present

## 2023-04-21 DIAGNOSIS — K2930 Chronic superficial gastritis without bleeding: Secondary | ICD-10-CM | POA: Diagnosis not present

## 2023-04-21 DIAGNOSIS — K222 Esophageal obstruction: Secondary | ICD-10-CM | POA: Diagnosis not present

## 2023-04-21 DIAGNOSIS — K3189 Other diseases of stomach and duodenum: Secondary | ICD-10-CM | POA: Diagnosis not present

## 2023-04-21 DIAGNOSIS — K449 Diaphragmatic hernia without obstruction or gangrene: Secondary | ICD-10-CM | POA: Diagnosis not present

## 2023-04-21 DIAGNOSIS — R6881 Early satiety: Secondary | ICD-10-CM | POA: Diagnosis not present

## 2023-04-21 DIAGNOSIS — K2100 Gastro-esophageal reflux disease with esophagitis, without bleeding: Secondary | ICD-10-CM | POA: Diagnosis not present

## 2023-04-21 DIAGNOSIS — K219 Gastro-esophageal reflux disease without esophagitis: Secondary | ICD-10-CM | POA: Diagnosis not present

## 2023-06-06 NOTE — Progress Notes (Unsigned)
Cardiology Office Note:    Date:  06/08/2023   ID:  Thomas Kennedy, DOB [birth year], MRN [id number]  PCP:  Daisy Floro, MD  Cardiologist:  Little Ishikawa, MD  Electrophysiologist:  None   Referring MD: Daisy Floro, MD   Chief Complaint  Patient presents with   Loss of Consciousness     History of Present Illness:    Thomas Kennedy is a 62 y.o. male with tobacco use, hypertension who presents for follow-up.  He was referred by Dr. Tenny Craw for evaluation of coronary calcifications on CT chest, initially seen 01/2021.  He states he has hypertension. He says when he takes his irbesartan 150 MG  his systolic blood pressure ranges 130s-135 and diastolic 80s-85s. He experiences occasional shortness of breath after showering, hot flashes, and palpitations that comes once a week and lasts about 5-10 minutes. He feels his heart racing when he has these episodes. He also has occasional lightheadedness. He includes he had a presyncopal episode when he went to the grocery store and had to hold on to the shelf for support. Denies syncope, LE edema or chest pains. He use to exercise regularly for about 30-40 minutes with no complications however has stopped 5 months ago. His father died from bladder cancer. He notes his father was not taking medication. His grandmother died from a massive MI. He smoked one pack of cigarettes a day for 35 years however has stopped smoking 8 years ago.   Calcium score 03/06/2021 was 51 (63rd percentile), dilated ascending aorta measuring 41 mm.  Echocardiogram 03/06/2021 showed normal biventricular function, no significant valvular disease.  Zio patch x 14 days 02/2021 showed no significant arrhythmias.  Echocardiogram 02/2023 showed EF 60 to 65%, normal diastolic function, normal RV function, no significant valvular disease, dilated aortic root measuring 44 mm.  Zio patch x 14 days 03/2023 showed 13 episodes of SVT with longest lasting 10 seconds with average  rate 128 bpm.  Since last clinic visit, he reports he is doing well.  Denies any further lightheadedness or syncope.  Denies any chest pain, dyspnea, lower extremity edema, or palpitations.   Past Medical History:  Diagnosis Date   COPD (chronic obstructive pulmonary disease) (HCC)    Hypertension     Past Surgical History:  Procedure Laterality Date   EYE SURGERY     LAPAROSCOPIC APPENDECTOMY N/A 02/08/2023   Procedure: APPENDECTOMY LAPAROSCOPIC;  Surgeon: Griselda Miner, MD;  Location: [facility] OR;  Service: General;  Laterality: N/A;    Current Medications: Current Meds  Medication Sig   Cholecalciferol (VITAMIN D-3) 25 MCG (1000 UT) CAPS Take 3,000 Units by mouth daily.   irbesartan (AVAPRO) 300 MG tablet Take 1 tablet (300 mg total) by mouth daily.   metroNIDAZOLE (METROCREAM) 0.75 % cream Apply 1 Application topically daily as needed (Rosacea).   Multiple Vitamin (MULTIVITAMIN WITH MINERALS) TABS tablet Take 1 tablet by mouth daily.   rosuvastatin (CRESTOR) 20 MG tablet Take 1 tablet (20 mg total) by mouth daily.   [DISCONTINUED] irbesartan (AVAPRO) 150 MG tablet Take 300 mg by mouth daily.     Allergies:   Ace inhibitors   Social History   Socioeconomic History   Marital status: Single    Spouse name: Not on file   Number of children: Not on file   Years of education: Not on file   Highest education level: Not on file  Occupational History   Not on file  Tobacco Use  Smoking status: Former    Current packs/day: 0.00    Types: Cigarettes    Quit date: 2014    Years since quitting: 10.8   Smokeless tobacco: Former  Substance and Sexual Activity   Alcohol use: Yes    Alcohol/week: 7.0 standard drinks of alcohol    Types: 7 Standard drinks or equivalent per week   Drug use: Never   Sexual activity: Not on file  Other Topics Concern   Not on file  Social History Narrative   Not on file   Social Determinants of Health   Financial Resource Strain: Not on file   Food Insecurity: No Food Insecurity (02/08/2023)   Hunger Vital Sign    Worried About Running Out of Food in the Last Year: Never true    Ran Out of Food in the Last Year: Never true  Transportation Needs: No Transportation Needs (02/08/2023)   PRAPARE - Administrator, Civil Service (Medical): No    Lack of Transportation (Non-Medical): No  Physical Activity: Not on file  Stress: Not on file  Social Connections: Not on file     Family History: The patient's family history is not on file.  ROS:   Please see the history of present illness.      All other systems reviewed and are negative.  EKGs/Labs/Other Studies Reviewed:    No prior CV studies available.  EKG:   07/22: sinus rhythm, rate 67, no ST abnormalities, Q-wave in V1 and V2  03/17/23: Normal sinus rhythm, rate 77, no ST abnormalities, Q-wave in V1/2  Recent Labs: 02/08/2023: ALT 53 02/09/2023: BUN 9; Creatinine, Ser 0.85; Hemoglobin 11.6; Platelets 218; Potassium 3.6; Sodium 137  Recent Lipid Panel No results found for: "CHOL", "TRIG", "HDL", "CHOLHDL", "VLDL", "LDLCALC", "LDLDIRECT"  Physical Exam:    VS:  BP (!) 140/82 (BP Location: Left Arm, Patient Position: Sitting, Cuff Size: Normal)   Pulse 92   Ht 5\' 10"  (1.778 m)   Wt 168 lb (76.2 kg)   SpO2 99%   BMI 24.11 kg/m     Wt Readings from Last 3 Encounters:  06/08/23 168 lb (76.2 kg)  03/17/23 168 lb (76.2 kg)  02/09/23 173 lb 8 oz (78.7 kg)     GEN:  Well nourished, well developed in no acute distress HEENT: Normal NECK: No JVD; No carotid bruits LYMPHATICS: No lymphadenopathy CARDIAC: RRR, no murmurs, rubs, gallops RESPIRATORY:  Clear to auscultation without rales, wheezing or rhonchi  ABDOMEN: Soft, non-tender, non-distended MUSCULOSKELETAL:  No edema; No deformity  SKIN: Warm and dry NEUROLOGIC:  Alert and oriented x 3 PSYCHIATRIC:  Normal affect   ASSESSMENT:    1. Syncope and collapse   2. Aortic dilatation (HCC)   3.  Hyperlipidemia, unspecified hyperlipidemia type   4. Essential hypertension      PLAN:     Syncope: Reported syncopal episode summer 2024.  Concerning for arrhythmia given lack of prodromal symptoms.  Echocardiogram 02/2023 showed EF 60 to 65%, normal diastolic function, normal RV function, no significant valvular disease, dilated aortic root measuring 44 mm.  Zio patch x 14 days 03/2023 showed 13 episodes of SVT with longest lasting 10 seconds with average rate 128 bpm.  Did discuss [facility] regulations that should be free of syncope x 6 months to drive -He reports no further syncopal or near syncopal episodes.  Will monitor  Hypertension: on irbesartan 150 mg daily.  BP elevated, recommend increasing irbesartan to 300 mg daily.  Check BMET in 1 week.  Asked to check BP daily for next 2 weeks and let us know results  Hyperlipidemia: LDL 132 on 12/12/2020.  Calcium score 03/06/2021 was 51 (63rd percentile).  Started on rosuvastatin 10 mg daily, LDL 87 on 01/21/2023.  Increased rosuvastatin to 20 mg daily for goal LDL less than 70.  Check lipid panel  Aortic dilatation: Ascending aorta measured 41mm on calcium score 02/2021, stable measuring 40mm on CT chest 02/2022.  Aortic root measured 44 mm on echocardiogram 02/2023, will monitor.  Plan repeat echocardiogram in 1 year  Palpitations: Zio patch x 14 days 02/2021 showed no significant arrhythmias.  Reports no recent palpitations.  Given syncopal episode underwent repeat Zio patch x 14 days 03/2023 which showed 13 episodes of SVT with longest lasting 10 seconds with average rate 128 bpm. -Denies any recent palpitations, will monitor  RTC in 6 months   Medication Adjustments/Labs and Tests Ordered: Current medicines are reviewed at length with the patient today.  Concerns regarding medicines are outlined above.  Orders Placed This Encounter  Procedures   Basic Metabolic Panel (BMET)   Lipid panel    Meds ordered this encounter   Medications   irbesartan (AVAPRO) 300 MG tablet    Sig: Take 1 tablet (300 mg total) by mouth daily.    Dispense:  90 tablet    Refill:  3     Patient Instructions  Medication Instructions:  Increase Irbesartan 300 mg daily Continue all current medications *If you need a refill on your cardiac medications before your next appointment, please call your pharmacy*   Lab Work: BMET, Lipid in week. YOU must be fasting If you have labs (blood work) drawn today and your tests are completely normal, you will receive your results only by: MyChart Message (if you have MyChart) OR A paper copy in the mail If you have any lab test that is abnormal or we need to change your treatment, we will call you to review the results.   Testing/Procedures: none   Follow-Up: At [facility], you and your health needs are our priority.  As part of our continuing mission to provide you with exceptional heart care, we have created designated Provider Care Teams.  These Care Teams include your primary Cardiologist (physician) and Advanced Practice Providers (APPs -  Physician Assistants and Nurse Practitioners) who all work together to provide you with the care you need, when you need it.  We recommend signing up for the patient portal called "MyChart".  Sign up information is provided on this After Visit Summary.  MyChart is used to connect with patients for Virtual Visits (Telemedicine).  Patients are able to view lab/test results, encounter notes, upcoming appointments, etc.  Non-urgent messages can be sent to your provider as well.   To learn more about what you can do with MyChart, go to [email or web address].    Your next appointment:   6 month(s)  Provider:   Little Ishikawa, MD     Other Instructions Please check blood pressure daily for 2 weeks and send via my chart       Signed, Little Ishikawa, MD  06/08/2023 3:08 PM    [facility]

## 2023-06-07 DIAGNOSIS — H26493 Other secondary cataract, bilateral: Secondary | ICD-10-CM | POA: Diagnosis not present

## 2023-06-08 ENCOUNTER — Encounter: Payer: Self-pay | Admitting: Cardiology

## 2023-06-08 ENCOUNTER — Ambulatory Visit: Payer: BC Managed Care – PPO | Attending: Cardiology | Admitting: Cardiology

## 2023-06-08 VITALS — BP 140/82 | HR 92 | Ht 70.0 in | Wt 168.0 lb

## 2023-06-08 DIAGNOSIS — I77819 Aortic ectasia, unspecified site: Secondary | ICD-10-CM | POA: Diagnosis not present

## 2023-06-08 DIAGNOSIS — R55 Syncope and collapse: Secondary | ICD-10-CM

## 2023-06-08 DIAGNOSIS — E785 Hyperlipidemia, unspecified: Secondary | ICD-10-CM

## 2023-06-08 DIAGNOSIS — I10 Essential (primary) hypertension: Secondary | ICD-10-CM

## 2023-06-08 MED ORDER — IRBESARTAN 300 MG PO TABS
300.0000 mg | ORAL_TABLET | Freq: Every day | ORAL | 3 refills | Status: DC
Start: 2023-06-08 — End: 2024-05-19

## 2023-06-08 NOTE — Patient Instructions (Signed)
Medication Instructions:  Increase Irbesartan 300 mg daily Continue all current medications *If you need a refill on your cardiac medications before your next appointment, please call your pharmacy*   Lab Work: BMET, Lipid in week. YOU must be fasting If you have labs (blood work) drawn today and your tests are completely normal, you will receive your results only by: MyChart Message (if you have MyChart) OR A paper copy in the mail If you have any lab test that is abnormal or we need to change your treatment, we will call you to review the results.   Testing/Procedures: none   Follow-Up: At [facility], you and your health needs are our priority.  As part of our continuing mission to provide you with exceptional heart care, we have created designated Provider Care Teams.  These Care Teams include your primary Cardiologist (physician) and Advanced Practice Providers (APPs -  Physician Assistants and Nurse Practitioners) who all work together to provide you with the care you need, when you need it.  We recommend signing up for the patient portal called "MyChart".  Sign up information is provided on this After Visit Summary.  MyChart is used to connect with patients for Virtual Visits (Telemedicine).  Patients are able to view lab/test results, encounter notes, upcoming appointments, etc.  Non-urgent messages can be sent to your provider as well.   To learn more about what you can do with MyChart, go to [email or web address].    Your next appointment:   6 month(s)  Provider:   Little Ishikawa, MD     Other Instructions Please check blood pressure daily for 2 weeks and send via my chart

## 2023-06-11 ENCOUNTER — Other Ambulatory Visit: Payer: Self-pay | Admitting: Cardiology

## 2023-07-23 ENCOUNTER — Encounter: Payer: Self-pay | Admitting: Cardiology

## 2023-07-23 DIAGNOSIS — R55 Syncope and collapse: Secondary | ICD-10-CM

## 2023-07-23 NOTE — Progress Notes (Unsigned)
Cardiology Office Note:  .   Date:  07/26/2023  ID:  Thomas Kennedy, DOB 02-12-1961, MRN [id number] PCP: Daisy Floro, MD  [facility] Providers Cardiologist:  Little Ishikawa, MD   History of Present Illness: .   Thomas Kennedy is a 62 y.o. male with a history of HTN, palpitations, presyncope.  The patient did have a coronary CTA with a calcium score on 03/06/2021 at 51.  Echocardiogram revealed an EF of 60 to 65% on 03/16/2023 with normal diastolic function.  Patient did have a Zio patch on 03/2023 showing 13 episodes of SVT with longest lasting 10 seconds and an average heart rate of 128 bpm.  Since last office visit, he send [place] a message stating that he had a syncopal episode while cooking on Christmas Day.  He awoke with his family looking over him.  Dr. Gaynelle Arabian responded to the message stating that he would need an internal loop recorder as he thought this might be arrhythmia genic. This has been scheduled for 08/19/2023 with Dr. Royann Shivers at 12 PM.  He is here for follow-up to discuss this further and for preprocedure instructions and supply.   Since episode on Christmas Day the patient has not had any further syncopal episodes.  He denies any presyncope, prodromal symptoms, associated with events.  This comes out of the blue for him and has become very concerning.  He denies chest pain, palpitations, shortness of breath, or extreme fatigue.  He is retired and is able to be very flexible in his schedule.  He has not been driving as requested.   ROS: As above otherwise negative  Studies Reviewed: .      Echocardiogram 03/22/2023 1. Left ventricular ejection fraction, by estimation, is 60 to 65%. The  left ventricle has normal function. The left ventricle has no regional  wall motion abnormalities. Left ventricular diastolic parameters were  normal.   2. Right ventricular systolic function is normal. The right ventricular  size is normal.   3. The mitral valve is  normal in structure. Mild mitral valve  regurgitation.   4. The aortic valve is tricuspid. Aortic valve regurgitation is trivial.   5. Aortic dilatation noted. There is mild dilatation of the aortic root,  measuring 44 mm.   6. The inferior vena cava is normal in size with greater than 50%  respiratory variability, suggesting right atrial pressure of 3 mmHg.   Physical Exam:   VS:  BP 122/80 (BP Location: Right Arm, Patient Position: Sitting, Cuff Size: Normal)   Pulse 87   Ht 5\' 10"  (1.778 m)   Wt 169 lb 3.2 oz (76.7 kg)   SpO2 91%   BMI 24.28 kg/m    Wt Readings from Last 3 Encounters:  07/26/23 169 lb 3.2 oz (76.7 kg)  06/08/23 168 lb (76.2 kg)  03/17/23 168 lb (76.2 kg)    GEN: Well nourished, well developed in no acute distress NECK: No JVD; No carotid bruits CARDIAC: RRR, tachycardic, no murmurs, rubs, gallops RESPIRATORY:  Clear to auscultation without rales, wheezing or rhonchi  ABDOMEN: Soft, non-tender, non-distended EXTREMITIES:  No edema; No deformity   ASSESSMENT AND PLAN: .    Recurrent syncopal episodes: He denies any chest pain or promote all symptoms.  An ILR insertion will be completed by Dr. Haskell Riling on 08/19/2023.  The patient is not to drive for minimum of 6 months.  He is not on any AV nodal blocking agents at this time.  He  does report to [place] further syncopal episodes if they were to occur.  I have explained the procedure, he is given the preprocedure Hibiclens with instructions as well as a written date concerning his ILR insertion.  No medications will be held prior to the procedure.  2.  Hypertension: Blood pressure is very well-controlled today on irbesartan 300 mg daily.  He denies any postural hypertension symptoms.  Continue current regimen.  3.  Hypercholesterolemia: He remains on rosuvastatin 20 mg daily.  Goal of cholesterol less than 200.  Labs are completed by PCP.         Signed, Bettey Mare. Liborio Nixon, ANP, AACC

## 2023-07-23 NOTE — Telephone Encounter (Signed)
Called patient with no answer. Unable to leave message. I did schedule patient for an appointment 12/30 with Reather Littler, NP. Waiting to hear back from patient if he is able to keep this appointment.

## 2023-07-23 NOTE — Telephone Encounter (Signed)
Spoke to patient.  This is his second episode of syncope without prodrome, sounds arrhythmogenic.  Previous work-up over the summer including echo and Zio was unremarkable.  Would recommend loop recorder.  Katie, can we schedule with Dr Royann Shivers for loop recorder?

## 2023-07-26 ENCOUNTER — Ambulatory Visit: Payer: BC Managed Care – PPO | Attending: Adult Health | Admitting: Adult Health

## 2023-07-26 ENCOUNTER — Encounter: Payer: Self-pay | Admitting: Adult Health

## 2023-07-26 VITALS — BP 122/80 | HR 87 | Ht 70.0 in | Wt 169.2 lb

## 2023-07-26 DIAGNOSIS — E7800 Pure hypercholesterolemia, unspecified: Secondary | ICD-10-CM

## 2023-07-26 DIAGNOSIS — I10 Essential (primary) hypertension: Secondary | ICD-10-CM

## 2023-07-26 DIAGNOSIS — R55 Syncope and collapse: Secondary | ICD-10-CM | POA: Diagnosis not present

## 2023-07-26 NOTE — Patient Instructions (Addendum)
Medication Instructions:  NO CHANGES    Lab Work: NONE    Testing/Procedures:     [facility] at [facility] [employer], [address]  [place], [place] [address]  Phone: [phone] Fax: [phone]   You are scheduled for a Loop Recorder Implant on: 08/19/2023.  This will take place at [address] [facility] [facility], [address].    Please arrive at your appointment 15-20 minutes early.   You do not need to be fasting.    The procedure is performed with local anesthesia. You will not receive sedatives nor will an IV be placed.    Wash your chest and neck with the surgical soap the evening before and the morning of your procedure. Please following the washing instructions provided.    As with all surgical implants, there is a small risk of infection. If an infection occurs, the device will be removed. To help reduce the risk, please use the surgical scrub provided. Additional antiseptic precautions will be taken at the time of the procedure.    Please bring your insurance cards.    You will be scheduled for a two week wound check visit with Dr. Royann Shivers. This will be a video visit. If you are unable to do a video visit then you may come into the office.   *Please note that scheduled loop recorder implants may need to be rescheduled if Dr. Royann Shivers has a procedure urgently added on at the hospital     Preparing for the Procedure   Before the procedure, you can play an important role. Because skin is not sterile, your skin needs to be as free of germs as possible. You can reduce the number of germs on your skin by washing with CHG (chlorhexidine gluconate) Soap before the procedure. CHG is an antiseptic cleaner which kills germs and bonds with the skin to continue killing germs even after washing.   Please do not use if you have an allergy to CHG or antibacterial soaps. If your skin becomes reddened/irritated, STOP using the CHG.   DO NOT SHAVE (including legs and underarms) for at least 48  hours prior to first CHG shower. It is OK to shave your face.   Please follow these instructions carefully: Shower the night before the procedure and the morning of with CHG Soap. If you chose to wash your hair, wash your hair first as usual with your normal shampoo/conditioner. After you shampoo/condition, rinse you hair and body thoroughly to remove shampoo/conditioner. Use CHG as you would any other liquid soap. You can apply CHG directly to the skin and wash gently with a loofah or a clean washcloth. Apply the CHG Soap to your body ONLY FROM THE NECK DOWN. Do not use on open wounds or open sores. Avoid contact with your eyes, ears, mouth, and genitals (private parts).  Wash thoroughly, paying special attention to the area where your surgery will be performed. Thoroughly rinse your body with warm water from the neck down. DO NOT shower/wash with your normal soap after using and rinsing off the CHG Soap. Pat yourself dry with a clean towel. Wear clean pajamas to bed. Place clean sheets on your bed the night of your first shower and do not sleep with pets..   Day of Surgery: Shower with the CHG Soap following the instructions listed above. DO NOT apply deodorants or lotions.    Follow-Up: At [facility], you and your health needs are our priority.  As part of our continuing mission to provide  you with exceptional heart care, we have created designated Provider Care Teams.  These Care Teams include your primary Cardiologist (physician) and Advanced Practice Providers (APPs -  Physician Assistants and Nurse Practitioners) who all work together to provide you with the care you need, when you need it.      Your next appointment:   KEEP SCHEDULED APPOINTMENT WITH DR. ZOXWRUEA    Provider:   DR. Royann Shivers  Other Instructions PROVIDER RECOMMEND YOU NOT TO DRIVE FOR 6 MONTHS.

## 2023-07-26 NOTE — Addendum Note (Signed)
Addended by: Scheryl Marten on: 07/26/2023 08:37 AM   Modules accepted: Orders

## 2023-08-19 ENCOUNTER — Ambulatory Visit: Payer: BC Managed Care – PPO | Attending: Cardiovascular Disease | Admitting: Cardiovascular Disease

## 2023-08-19 DIAGNOSIS — Z95818 Presence of other cardiac implants and grafts: Secondary | ICD-10-CM

## 2023-08-19 DIAGNOSIS — R55 Syncope and collapse: Secondary | ICD-10-CM | POA: Diagnosis not present

## 2023-08-19 MED ORDER — LIDOCAINE-EPINEPHRINE 1 %-1:100000 IJ SOLN
10.0000 mL | Freq: Once | INTRAMUSCULAR | Status: AC
  Administered 2023-08-19: 10 mL via INTRADERMAL

## 2023-08-19 NOTE — Patient Instructions (Addendum)
  Implantable Loop Recorder Placement, Care After This sheet gives you information about how to care for yourself after your procedure. Your health care provider may also give you more specific instructions. If you have problems or questions, contact your health care provider. What can I expect after the procedure? After the procedure, it is common to have: Soreness or discomfort near the incision. Some swelling or bruising near the incision.  Follow these instructions at home: Incision care  Monitor your cardiac device site for redness, swelling, and drainage. Call the device clinic at [phone] if you experience these symptoms or fever/chills.  Keep the large square bandage on your site for 24 hours and then you may remove it yourself. Keep the steri-strips underneath in place.   You may shower after 72 hours / 3 days from your procedure with the steri-strips in place. They will usually fall off on their own, or may be removed after 10 days. Pat dry.   Avoid lotions, ointments, or perfumes over your incision until it is well-healed.  Please do not submerge in water until your site is completely healed.   Your device is MRI compatible.   Remote monitoring is used to monitor your cardiac device from home. This monitoring is scheduled every month by our office. It allows [place] to keep an eye on the function of your device to ensure it is working properly.  If your wound site starts to bleed apply pressure.    For help with the monitor please call [employer] Monitor Support Specialist directly at [phone].    If you have any questions/concerns please call the device clinic at [phone].  Activity  Return to your normal activities.  General instructions Follow instructions from your health care provider about how to manage your implantable loop recorder and transmit the information. Learn how to activate a recording if this is necessary for your type of device. You may go  through a metal detection gate, and you may let someone hold a metal detector over your chest. Show your ID card if needed. Do not have an MRI unless you check with your health care provider first. Take over-the-counter and prescription medicines only as told by your health care provider. Keep all follow-up visits as told by your health care provider. This is important. Contact a health care provider if: You have redness, swelling, or pain around your incision. You have a fever. You have pain that is not relieved by your pain medicine. You have triggered your device because of fainting (syncope) or because of a heartbeat that feels like it is racing, slow, fluttering, or skipping (palpitations). Get help right away if you have: Chest pain. Difficulty breathing. Summary After the procedure, it is common to have soreness or discomfort near the incision. Change your dressing as told by your health care provider. Follow instructions from your health care provider about how to manage your implantable loop recorder and transmit the information. Keep all follow-up visits as told by your health care provider. This is important. This information is not intended to replace advice given to you by your health care provider. Make sure you discuss any questions you have with your health care provider. Document Released: 06/24/2015 Document Revised: 08/28/2017 Document Reviewed: 08/28/2017 Elsevier Patient Education  2020 [employer].

## 2023-08-19 NOTE — Progress Notes (Signed)
LOOP RECORDER IMPLANT   Procedure report  Procedure performed:  Loop recorder implantation   Reason for procedure:  Recurrent syncope/near-syncope  Procedure performed by:  Thurmon Fair, MD  Complications:  None  Estimated blood loss:  <5 mL  Medications administered during procedure:  Lidocaine 1% with 1/100,000 epinephrine 10 mL locally Device details:  Medtronic Reveal Linq model number [id number], serial number [id number] Procedure details:  After the risks and benefits of the procedure were discussed the patient provided informed consent. The patient was prepped and draped in usual sterile fashion. Local anesthesia was administered to an area 2 cm to the left of the sternum in the 4th intercostal space. A cutaneous incision was made using the incision tool. The introducer was then used to create a subcutaneous tunnel and carefully deploy the device. Local pressure was held to ensure hemostasis.  The incision was closed with SteriStrips and a sterile dressing was applied.  R waves 0.34 mV.  Thurmon Fair, MD, [facility] [phone] office [phone] pager 08/19/2023 12:59 PM

## 2023-09-02 ENCOUNTER — Ambulatory Visit: Payer: BC Managed Care – PPO | Attending: Cardiovascular Disease | Admitting: Cardiovascular Disease

## 2023-09-02 ENCOUNTER — Telehealth: Payer: Self-pay | Admitting: *Deleted

## 2023-09-02 DIAGNOSIS — Z95818 Presence of other cardiac implants and grafts: Secondary | ICD-10-CM

## 2023-09-02 DIAGNOSIS — Z95810 Presence of automatic (implantable) cardiac defibrillator: Secondary | ICD-10-CM

## 2023-09-02 NOTE — Telephone Encounter (Signed)
 Thomas Kennedy

## 2023-09-02 NOTE — Progress Notes (Signed)
 Video visit after loop recorder implantation.  The site has healed very well and there is no evidence of infection.  Discussed remote transmission schedule.  Will follow-up with Dr. Shuman and we will provide him with loop recorder downloads every 30 days.

## 2023-09-10 NOTE — Telephone Encounter (Signed)
Open error

## 2023-09-15 DIAGNOSIS — Z961 Presence of intraocular lens: Secondary | ICD-10-CM | POA: Diagnosis not present

## 2023-09-15 DIAGNOSIS — H338 Other retinal detachments: Secondary | ICD-10-CM | POA: Diagnosis not present

## 2023-09-28 ENCOUNTER — Ambulatory Visit: Payer: BC Managed Care – PPO

## 2023-09-28 DIAGNOSIS — R55 Syncope and collapse: Secondary | ICD-10-CM

## 2023-09-30 LAB — CUP PACEART REMOTE DEVICE CHECK
Date Time Interrogation Session: 20250303233627
Implantable Pulse Generator Implant Date: 20250123

## 2023-10-19 ENCOUNTER — Telehealth: Payer: Self-pay

## 2023-10-19 NOTE — Telephone Encounter (Signed)
 Alert received from [employer]:  Alert remote transmission:  tachy Event occurred 3/23 @ 18:28, duration 47sec, HR 182, NCT with brief oversensing at beginning of EGM - route to triage  Outreach made to Pt:

## 2023-10-19 NOTE — Telephone Encounter (Signed)
 Thomas Kennedy, Implanted his ILR for unxpained syncope. The baseline underlying rhythm is rather fast for his age, but regular at about 175 bpm. This looks like sinus tachycardia with PACs and bursts of very short PAT. It is not afib and I do not think it is flutter, Either way, not associated w syncope and happened while actively vomiting. I say just continue to monitor. Karey Stucki

## 2023-10-19 NOTE — Telephone Encounter (Signed)
 Patient was around the time of event patient had vomiting event with no associated symptoms.   Patient states last week he had increased fatigue (suspected flu of covid ~both were negative).   Patient aware to call with future symptoms.

## 2023-11-02 ENCOUNTER — Ambulatory Visit (INDEPENDENT_AMBULATORY_CARE_PROVIDER_SITE_OTHER): Payer: BC Managed Care – PPO

## 2023-11-02 DIAGNOSIS — R55 Syncope and collapse: Secondary | ICD-10-CM

## 2023-11-02 LAB — CUP PACEART REMOTE DEVICE CHECK
Date Time Interrogation Session: 20250407233610
Implantable Pulse Generator Implant Date: 20250123

## 2023-11-03 NOTE — Addendum Note (Signed)
 Addended by: Geralyn Flash D on: 11/03/2023 02:49 PM   Modules accepted: Orders

## 2023-11-03 NOTE — Progress Notes (Signed)
 [facility] Summary Report / Loop Recorder

## 2023-11-13 ENCOUNTER — Encounter: Payer: Self-pay | Admitting: Cardiovascular Disease

## 2023-11-18 ENCOUNTER — Encounter (HOSPITAL_BASED_OUTPATIENT_CLINIC_OR_DEPARTMENT_OTHER): Payer: Self-pay | Admitting: Cardiology

## 2023-11-18 NOTE — Telephone Encounter (Signed)
 BP labile but overall looks okay.  Looks like he is due for clinic visit, can we get him scheduled sometime in the next few months?

## 2023-12-07 ENCOUNTER — Ambulatory Visit: Payer: BC Managed Care – PPO | Attending: Cardiovascular Disease

## 2023-12-07 DIAGNOSIS — R55 Syncope and collapse: Secondary | ICD-10-CM

## 2023-12-07 LAB — CUP PACEART REMOTE DEVICE CHECK
Date Time Interrogation Session: 20250512233615
Implantable Pulse Generator Implant Date: 20250123

## 2023-12-08 ENCOUNTER — Ambulatory Visit: Payer: Self-pay | Admitting: Cardiovascular Disease

## 2023-12-21 NOTE — Progress Notes (Signed)
 [facility] Summary Report / Loop Recorder

## 2024-01-06 ENCOUNTER — Ambulatory Visit (INDEPENDENT_AMBULATORY_CARE_PROVIDER_SITE_OTHER): Payer: Self-pay

## 2024-01-06 DIAGNOSIS — R55 Syncope and collapse: Secondary | ICD-10-CM | POA: Diagnosis not present

## 2024-01-07 LAB — CUP PACEART REMOTE DEVICE CHECK
Date Time Interrogation Session: 20250612233611
Implantable Pulse Generator Implant Date: 20250123

## 2024-01-19 ENCOUNTER — Ambulatory Visit: Payer: Self-pay | Admitting: Cardiovascular Disease

## 2024-01-20 NOTE — Progress Notes (Signed)
 [facility] Summary Report / Loop Recorder

## 2024-02-07 ENCOUNTER — Ambulatory Visit: Payer: Self-pay

## 2024-02-07 ENCOUNTER — Ambulatory Visit: Payer: Self-pay | Admitting: Cardiovascular Disease

## 2024-02-07 DIAGNOSIS — R55 Syncope and collapse: Secondary | ICD-10-CM | POA: Diagnosis not present

## 2024-02-07 LAB — CUP PACEART REMOTE DEVICE CHECK
Date Time Interrogation Session: 20250714004852
Implantable Pulse Generator Implant Date: 20250123

## 2024-02-21 DIAGNOSIS — E7800 Pure hypercholesterolemia, unspecified: Secondary | ICD-10-CM | POA: Diagnosis not present

## 2024-02-21 DIAGNOSIS — E538 Deficiency of other specified B group vitamins: Secondary | ICD-10-CM | POA: Diagnosis not present

## 2024-02-21 DIAGNOSIS — I10 Essential (primary) hypertension: Secondary | ICD-10-CM | POA: Diagnosis not present

## 2024-02-21 DIAGNOSIS — Z0000 Encounter for general adult medical examination without abnormal findings: Secondary | ICD-10-CM | POA: Diagnosis not present

## 2024-02-25 NOTE — Progress Notes (Signed)
 [facility] Summary Report / Loop Recorder

## 2024-02-25 NOTE — Addendum Note (Signed)
 Addended by: VICCI SELLER A on: 02/25/2024 09:46 AM   Modules accepted: Orders

## 2024-03-01 DIAGNOSIS — I10 Essential (primary) hypertension: Secondary | ICD-10-CM | POA: Diagnosis not present

## 2024-03-01 DIAGNOSIS — E538 Deficiency of other specified B group vitamins: Secondary | ICD-10-CM | POA: Diagnosis not present

## 2024-03-01 DIAGNOSIS — Z131 Encounter for screening for diabetes mellitus: Secondary | ICD-10-CM | POA: Diagnosis not present

## 2024-03-01 DIAGNOSIS — E7800 Pure hypercholesterolemia, unspecified: Secondary | ICD-10-CM | POA: Diagnosis not present

## 2024-03-01 DIAGNOSIS — Z125 Encounter for screening for malignant neoplasm of prostate: Secondary | ICD-10-CM | POA: Diagnosis not present

## 2024-03-09 ENCOUNTER — Ambulatory Visit (INDEPENDENT_AMBULATORY_CARE_PROVIDER_SITE_OTHER): Payer: Self-pay

## 2024-03-09 DIAGNOSIS — R55 Syncope and collapse: Secondary | ICD-10-CM

## 2024-03-09 LAB — CUP PACEART REMOTE DEVICE CHECK
Date Time Interrogation Session: 20250813234457
Implantable Pulse Generator Implant Date: 20250123

## 2024-03-19 ENCOUNTER — Ambulatory Visit: Payer: Self-pay | Admitting: Cardiovascular Disease

## 2024-03-22 ENCOUNTER — Other Ambulatory Visit: Payer: Self-pay | Admitting: Cardiology

## 2024-03-22 DIAGNOSIS — E785 Hyperlipidemia, unspecified: Secondary | ICD-10-CM

## 2024-03-28 DIAGNOSIS — Z961 Presence of intraocular lens: Secondary | ICD-10-CM | POA: Diagnosis not present

## 2024-03-28 DIAGNOSIS — H338 Other retinal detachments: Secondary | ICD-10-CM | POA: Diagnosis not present

## 2024-04-10 ENCOUNTER — Ambulatory Visit (INDEPENDENT_AMBULATORY_CARE_PROVIDER_SITE_OTHER): Payer: Self-pay

## 2024-04-10 DIAGNOSIS — R55 Syncope and collapse: Secondary | ICD-10-CM

## 2024-04-11 LAB — CUP PACEART REMOTE DEVICE CHECK
Date Time Interrogation Session: 20250913233647
Implantable Pulse Generator Implant Date: 20250123

## 2024-04-13 ENCOUNTER — Ambulatory Visit: Payer: Self-pay | Admitting: Cardiovascular Disease

## 2024-04-17 ENCOUNTER — Other Ambulatory Visit: Payer: Self-pay | Admitting: Acute Care

## 2024-04-17 DIAGNOSIS — Z87891 Personal history of nicotine dependence: Secondary | ICD-10-CM

## 2024-04-17 DIAGNOSIS — Z122 Encounter for screening for malignant neoplasm of respiratory organs: Secondary | ICD-10-CM

## 2024-04-17 NOTE — Progress Notes (Signed)
 Remote Loop Recorder Transmission

## 2024-04-19 NOTE — Progress Notes (Signed)
 Remote Loop Recorder Transmission

## 2024-04-25 ENCOUNTER — Ambulatory Visit
Admission: RE | Admit: 2024-04-25 | Discharge: 2024-04-25 | Disposition: A | Discharge status: Home / Self Care | Source: Ambulatory Visit | Attending: Acute Care | Admitting: Acute Care

## 2024-04-25 DIAGNOSIS — Z87891 Personal history of nicotine dependence: Secondary | ICD-10-CM

## 2024-04-25 DIAGNOSIS — Z122 Encounter for screening for malignant neoplasm of respiratory organs: Secondary | ICD-10-CM

## 2024-05-01 ENCOUNTER — Other Ambulatory Visit: Payer: Self-pay | Admitting: Acute Care

## 2024-05-01 DIAGNOSIS — Z122 Encounter for screening for malignant neoplasm of respiratory organs: Secondary | ICD-10-CM

## 2024-05-01 DIAGNOSIS — Z87891 Personal history of nicotine dependence: Secondary | ICD-10-CM

## 2024-05-03 NOTE — Progress Notes (Signed)
 Remote Loop Recorder Transmission

## 2024-05-12 ENCOUNTER — Ambulatory Visit: Payer: Self-pay

## 2024-05-12 DIAGNOSIS — R55 Syncope and collapse: Secondary | ICD-10-CM

## 2024-05-14 LAB — CUP PACEART REMOTE DEVICE CHECK
Date Time Interrogation Session: 20251016234102
Implantable Pulse Generator Implant Date: 20250123

## 2024-05-16 NOTE — Progress Notes (Signed)
 Remote Loop Recorder Transmission

## 2024-05-17 ENCOUNTER — Ambulatory Visit: Payer: Self-pay | Admitting: Cardiovascular Disease

## 2024-05-19 ENCOUNTER — Telehealth: Payer: Self-pay | Admitting: Cardiology

## 2024-05-19 DIAGNOSIS — I10 Essential (primary) hypertension: Secondary | ICD-10-CM

## 2024-05-19 MED ORDER — IRBESARTAN 300 MG PO TABS: 300.0000 mg | ORAL_TABLET | Freq: Every day | ORAL | 0 refills | Status: DC

## 2024-05-19 NOTE — Telephone Encounter (Signed)
*  STAT* If patient is at the pharmacy, call can be transferred to refill team.   1. Which medications need to be refilled? (please list name of each medication and dose if known) irbesartan  (AVAPRO ) 300 MG tablet    2. Would you like to learn more about the convenience, safety, & potential cost savings by using the [facility]?no    3. Are you open to using the [facility] (Type [facility].  ). no  4. Which pharmacy/location (including street and city if local pharmacy) is medication to be sent to?  [facility] [address] [place], [place] [address]  5. Do they need a 30 day or 90 day supply? 30 day   Pt is out of medication and only wants to use this pharmacy one time.

## 2024-05-19 NOTE — Telephone Encounter (Signed)
 Pt's medication was sent to pt's pharmacy as requested. Confirmation received.

## 2024-06-15 ENCOUNTER — Other Ambulatory Visit: Payer: Self-pay | Admitting: Cardiology

## 2024-06-15 ENCOUNTER — Ambulatory Visit: Attending: Cardiovascular Disease

## 2024-06-15 DIAGNOSIS — I10 Essential (primary) hypertension: Secondary | ICD-10-CM

## 2024-06-15 DIAGNOSIS — R55 Syncope and collapse: Secondary | ICD-10-CM

## 2024-06-15 LAB — CUP PACEART REMOTE DEVICE CHECK
Date Time Interrogation Session: 20251119233739
Implantable Pulse Generator Implant Date: 20250123

## 2024-06-18 ENCOUNTER — Ambulatory Visit: Payer: Self-pay | Admitting: Cardiovascular Disease

## 2024-06-19 NOTE — Progress Notes (Signed)
 Remote Loop Recorder Transmission

## 2024-06-26 ENCOUNTER — Telehealth: Payer: Self-pay

## 2024-06-26 NOTE — Telephone Encounter (Signed)
 Alert received from [employer] event occurred 11/26 @ 16:20, duration 1hr , mean HR 140, variable R-R, can not rule out AF.  Attempted to contact patient. No answer, left message to call back.

## 2024-06-27 NOTE — Telephone Encounter (Signed)
 Attempted to contact the patient.  No answer- I left a message to please call back to the [facility].

## 2024-06-28 NOTE — Telephone Encounter (Signed)
 Sent my chart message requesting a call back to discuss event.

## 2024-06-28 NOTE — Telephone Encounter (Signed)
 Thank you, Mandy.  I agree that looks highly suspicious for atrial fibrillation.  However, this loop recorder was implanted for syncope and his embolic risk is low, even if it is atrial fibrillation.  Will continue to monitor for now.

## 2024-06-28 NOTE — Telephone Encounter (Addendum)
 Patient says that around that time he and his mother had a very intense argument.  Not surprised that his heart was beating very fast the adrenaline was flowing.   Otherwise, nothing out of the ordinary and has been doing well.  Forwarding to Dr. Francyne to confirm if ?Afib - duration 1hour , median rate 140bpm, max 300bpm -  and any recommendations?  Patient says if all is well and no changes, do not have to worry about calling him back.   I did explain to patient that rhythm is suspicious for Atrial fibrillation, but 1 hour is a low burden. If confirmed, will follow Dr. Tyrone recommendations to continue to monitor for more events v/s consideration for OAC initiation.

## 2024-06-28 NOTE — Telephone Encounter (Signed)
 Sent my chart message with Dr. Tyrone comments.

## 2024-06-29 NOTE — Telephone Encounter (Signed)
 He is due for an appointment, could we add him to my schedule at 1020 on 12/18?

## 2024-07-03 NOTE — Telephone Encounter (Signed)
 Called and unable to leave a message for app 12/18 @11 :00 per Dr. Kate

## 2024-07-07 NOTE — Telephone Encounter (Signed)
Pt called back returning call.

## 2024-07-07 NOTE — Telephone Encounter (Signed)
 Spoke to patient to inform about his upcoming apt on 07/13/24 @ 11:00 AM. Location, date and time discussed with patient w/ verbal understanding. Patient was appreciative of call.

## 2024-07-07 NOTE — Telephone Encounter (Signed)
Attempted to contact patient. No answer, left message to call back

## 2024-07-12 NOTE — Progress Notes (Unsigned)
 Cardiology Office Note:    Date:  07/13/2024   ID:  Thomas Kennedy, DOB 1960-09-01, MRN [id number]  PCP:  Okey Carlin Redbird, MD  Cardiologist:  Lonni LITTIE Nanas, MD  Electrophysiologist:  None   Referring MD: Okey Carlin Redbird, MD   Chief Complaint  Patient presents with   Loss of Consciousness     History of Present Illness:    Thomas Kennedy is a 63 y.o. male with tobacco use, hypertension who presents for follow-up.  He was referred by Dr. Okey for evaluation of coronary calcifications on CT chest, initially seen 01/2021.    Calcium  score 03/06/2021 was 51 (63rd percentile), dilated ascending aorta measuring 41 mm.  Echocardiogram 03/06/2021 showed normal biventricular function, no significant valvular disease.  Zio patch x 14 days 02/2021 showed no significant arrhythmias.  Echocardiogram 02/2023 showed EF 60 to 65%, normal diastolic function, normal RV function, no significant valvular disease, dilated aortic root measuring 44 mm.  Zio patch x 14 days 03/2023 showed 13 episodes of SVT with longest lasting 10 seconds with average rate 128 bpm.  Underwent loop recorder placement for recurrent syncope 08/19/2023.  No etiology of syncope seen so far but was noted to have likely atrial fibrillation.  Since last clinic visit, he reports he had an episode of syncope a few months ago, had been lying down and then stood up and passed out.  Thinks he may have been dehydrated that day.  Denies any other episodes of syncope.  There was an alert from his loop recorder on 11/26, he does not recall any symptoms from this. Denies any chest pain, dyspnea, lower extremity edema, or palpitations.    Past Medical History:  Diagnosis Date   COPD (chronic obstructive pulmonary disease) (HCC)    Hypertension     Past Surgical History:  Procedure Laterality Date   EYE SURGERY     LAPAROSCOPIC APPENDECTOMY N/A 02/08/2023   Procedure: APPENDECTOMY LAPAROSCOPIC;  Surgeon: Curvin Mt III, MD;   Location: [facility] OR;  Service: General;  Laterality: N/A;    Current Medications: Current Meds  Medication Sig   irbesartan  (AVAPRO ) 300 MG tablet TAKE 1 TABLET BY MOUTH EVERY DAY   metroNIDAZOLE (METROCREAM) 0.75 % cream Apply 1 Application topically daily as needed (Rosacea).   rosuvastatin  (CRESTOR ) 20 MG tablet TAKE 1 TABLET BY MOUTH EVERY DAY     Allergies:   Ace inhibitors and Lisinopril   Social History   Socioeconomic History   Marital status: Single    Spouse name: Not on file   Number of children: Not on file   Years of education: Not on file   Highest education level: Not on file  Occupational History   Not on file  Tobacco Use   Smoking status: Former    Current packs/day: 0.00    Types: Cigarettes    Quit date: 2014    Years since quitting: 11.9   Smokeless tobacco: Former  Substance and Sexual Activity   Alcohol use: Yes    Alcohol/week: 7.0 standard drinks of alcohol    Types: 7 Standard drinks or equivalent per week   Drug use: Never   Sexual activity: Not on file  Other Topics Concern   Not on file  Social History Narrative   Not on file   Social Drivers of Health   Tobacco Use: Medium Risk (07/13/2024)   Patient History    Smoking Tobacco Use: Former    Smokeless Tobacco Use: Former  Passive Exposure: Not on file  Financial Resource Strain: Not on file  Food Insecurity: No Food Insecurity (02/08/2023)   Hunger Vital Sign    Worried About Running Out of Food in the Last Year: Never true    Ran Out of Food in the Last Year: Never true  Transportation Needs: No Transportation Needs (02/08/2023)   PRAPARE - Administrator, Civil Service (Medical): No    Lack of Transportation (Non-Medical): No  Physical Activity: Not on file  Stress: Not on file  Social Connections: Not on file  Depression (EYV7-0): Not on file  Alcohol Screen: Not on file  Housing: Low Risk (02/08/2023)   Housing    Last Housing Risk Score: 0  Utilities: Not At  Risk (02/08/2023)   AHC Utilities    Threatened with loss of utilities: No  Health Literacy: Not on file     Family History: The patient's family history is not on file.  ROS:   Please see the history of present illness.      All other systems reviewed and are negative.  EKGs/Labs/Other Studies Reviewed:    No prior CV studies available.  EKG:   07/22: sinus rhythm, rate 67, no ST abnormalities, Q-wave in V1 and V2  03/17/23: Normal sinus rhythm, rate 77, no ST abnormalities, Q-wave in V1/2 07/13/2024: Normal sinus rhythm, first-degree AV block, frequent PVCs  Recent Labs: No results found for requested labs within last 365 days.  Recent Lipid Panel No results found for: CHOL, TRIG, HDL, CHOLHDL, VLDL, LDLCALC, LDLDIRECT  Physical Exam:    VS:  BP 114/70 (BP Location: Left Arm, Patient Position: Sitting, Cuff Size: Large)   Pulse 80   Ht 5' 10 (1.778 m)   Wt 169 lb (76.7 kg)   SpO2 96%   BMI 24.25 kg/m     Wt Readings from Last 3 Encounters:  07/13/24 169 lb (76.7 kg)  07/26/23 169 lb 3.2 oz (76.7 kg)  06/08/23 168 lb (76.2 kg)     GEN:  Well nourished, well developed in no acute distress HEENT: Normal NECK: No JVD; No carotid bruits LYMPHATICS: No lymphadenopathy CARDIAC: RRR, no murmurs, rubs, gallops RESPIRATORY:  Clear to auscultation without rales, wheezing or rhonchi  ABDOMEN: Soft, non-tender, non-distended MUSCULOSKELETAL:  No edema; No deformity  SKIN: Warm and dry NEUROLOGIC:  Alert and oriented x 3 PSYCHIATRIC:  Normal affect   ASSESSMENT:    1. Syncope and collapse   2. PAF (paroxysmal atrial fibrillation) (HCC)   3. Coronary artery calcification   4. Essential hypertension   5. Hyperlipidemia, unspecified hyperlipidemia type   6. Aortic dilatation   7. PVC's (premature ventricular contractions)       PLAN:     Syncope: Reported syncopal episode summer 2024.  Concerning for arrhythmia given lack of prodromal symptoms.   Echocardiogram 02/2023 showed EF 60 to 65%, normal diastolic function, normal RV function, no significant valvular disease, dilated aortic root measuring 44 mm.  Zio patch x 14 days 03/2023 showed 13 episodes of SVT with longest lasting 10 seconds with average rate 128 bpm.  Had another syncopal episode on Christmas Day in 2024.  Underwent loop recorder placement with Dr. Francyne 08/19/2023. - Continue to monitor lop recorder - Reported recent syncopal episode that sounded orthostatic.  Think he may have been dehydrated that day.  Orthostatics in clinic today unremarkable  Paroxysmal atrial fibrillation: Episode of suspected atrial fibrillation noted on loop recorder, lasted for about 80 minutes.  CHA2DS2-VASc 1 (hypertension).  Given low CHA2DS2-VASc and short episode of A-fib, will hold off on anticoagulation at this time but continue to monitor  Hypertension: on irbesartan  300 mg daily.  Appears controlled  Hyperlipidemia: LDL 132 on 12/12/2020.  Calcium  score 03/06/2021 was 51 (63rd percentile).  Started on rosuvastatin  10 mg daily, LDL 87 on 01/21/2023.  Increased rosuvastatin  to 20 mg daily for goal LDL less than 70.  Check lipid panel  Aortic dilatation: Ascending aorta measured 41mm on calcium  score 02/2021, stable measuring 40mm on CT chest 02/2022.  Aortic root measured 44 mm on echocardiogram 02/2023, will monitor.   - Check echocardiogram to monitor  Palpitations: Zio patch x 14 days 02/2021 showed no significant arrhythmias.  Reports no recent palpitations.  Given syncopal episode underwent repeat Zio patch x 14 days 03/2023 which showed 13 episodes of SVT with longest lasting 10 seconds with average rate 128 bpm. -Denies any recent palpitations, will monitor  PVCs: Frequent PVCs on EKG in clinic today.  Most recent loop interrogation shows PVC burden 5%.  Check BMET, magnesium, TSH  RTC in 6 months   Medication Adjustments/Labs and Tests Ordered: Current medicines are reviewed at length  with the patient today.  Concerns regarding medicines are outlined above.  Orders Placed This Encounter  Procedures   Basic Metabolic Panel (BMET)   Magnesium   TSH   Lipid panel   EKG 12-Lead   ECHOCARDIOGRAM COMPLETE    No orders of the defined types were placed in this encounter.    Patient Instructions  Medication Instructions:  No Changes *If you need a refill on your cardiac medications before your next appointment, please call your pharmacy*  Lab Work: Today: BMET, Magnesium, TSH, Lipid Panel  If you have labs (blood work) drawn today and your tests are completely normal, you will receive your results only by: MyChart Message (if you have MyChart) OR A paper copy in the mail If you have any lab test that is abnormal or we need to change your treatment, we will call you to review the results.  Testing/Procedures: Your physician has requested that you have an echocardiogram. Echocardiography is a painless test that uses sound waves to create images of your heart. It provides your doctor with information about the size and shape of your heart and how well your hearts chambers and valves are working. This procedure takes approximately one hour. There are no restrictions for this procedure. Please do NOT wear cologne, perfume, aftershave, or lotions (deodorant is allowed). Please arrive 15 minutes prior to your appointment time.  Please note: We ask at that you not bring children with you during ultrasound (echo/ vascular) testing. Due to room size and safety concerns, children are not allowed in the ultrasound rooms during exams. Our front office staff cannot provide observation of children in our lobby area while testing is being conducted. An adult accompanying a patient to their appointment will only be allowed in the ultrasound room at the discretion of the ultrasound technician under special circumstances. We apologize for any inconvenience.   Follow-Up: At [facility], you and your health needs are our priority.  As part of our continuing mission to provide you with exceptional heart care, our providers are all part of one team.  This team includes your primary Cardiologist (physician) and Advanced Practice Providers or APPs (Physician Assistants and Nurse Practitioners) who all work together to provide you with the care you need, when you need it.  Your  next appointment:   6 month(s) (Due around 01/11/2025)  Provider:   Lonni LITTIE Nanas, MD   Other Instructions Please call us  or send a MyChart message with any Cardiology related questions/concerns.  [phone].  Thank you!               Signed, Lonni LITTIE Nanas, MD  07/13/2024 12:10 PM    [facility]

## 2024-07-13 ENCOUNTER — Encounter: Payer: Self-pay | Admitting: Cardiology

## 2024-07-13 ENCOUNTER — Ambulatory Visit: Attending: Cardiovascular Disease | Admitting: Cardiology

## 2024-07-13 VITALS — BP 114/70 | HR 80 | Ht 70.0 in | Wt 169.0 lb

## 2024-07-13 DIAGNOSIS — I2510 Atherosclerotic heart disease of native coronary artery without angina pectoris: Secondary | ICD-10-CM | POA: Diagnosis not present

## 2024-07-13 DIAGNOSIS — R55 Syncope and collapse: Secondary | ICD-10-CM | POA: Diagnosis not present

## 2024-07-13 DIAGNOSIS — E785 Hyperlipidemia, unspecified: Secondary | ICD-10-CM | POA: Diagnosis not present

## 2024-07-13 DIAGNOSIS — I480 Paroxysmal atrial fibrillation: Secondary | ICD-10-CM

## 2024-07-13 DIAGNOSIS — I77819 Aortic ectasia, unspecified site: Secondary | ICD-10-CM

## 2024-07-13 DIAGNOSIS — I493 Ventricular premature depolarization: Secondary | ICD-10-CM | POA: Diagnosis not present

## 2024-07-13 DIAGNOSIS — I10 Essential (primary) hypertension: Secondary | ICD-10-CM | POA: Diagnosis not present

## 2024-07-13 NOTE — Patient Instructions (Signed)
 Medication Instructions:  No Changes *If you need a refill on your cardiac medications before your next appointment, please call your pharmacy*  Lab Work: Today: BMET, Magnesium, TSH, Lipid Panel  If you have labs (blood work) drawn today and your tests are completely normal, you will receive your results only by: MyChart Message (if you have MyChart) OR A paper copy in the mail If you have any lab test that is abnormal or we need to change your treatment, we will call you to review the results.  Testing/Procedures: Your physician has requested that you have an echocardiogram. Echocardiography is a painless test that uses sound waves to create images of your heart. It provides your doctor with information about the size and shape of your heart and how well your hearts chambers and valves are working. This procedure takes approximately one hour. There are no restrictions for this procedure. Please do NOT wear cologne, perfume, aftershave, or lotions (deodorant is allowed). Please arrive 15 minutes prior to your appointment time.  Please note: We ask at that you not bring children with you during ultrasound (echo/ vascular) testing. Due to room size and safety concerns, children are not allowed in the ultrasound rooms during exams. Our front office staff cannot provide observation of children in our lobby area while testing is being conducted. An adult accompanying a patient to their appointment will only be allowed in the ultrasound room at the discretion of the ultrasound technician under special circumstances. We apologize for any inconvenience.   Follow-Up: At [facility], you and your health needs are our priority.  As part of our continuing mission to provide you with exceptional heart care, our providers are all part of one team.  This team includes your primary Cardiologist (physician) and Advanced Practice Providers or APPs (Physician Assistants and Nurse Practitioners) who all  work together to provide you with the care you need, when you need it.  Your next appointment:   6 month(s) (Due around 01/11/2025)  Provider:   Lonni LITTIE Nanas, MD   Other Instructions Please call us  or send a MyChart message with any Cardiology related questions/concerns.  [phone].  Thank you!

## 2024-07-14 ENCOUNTER — Ambulatory Visit: Payer: Self-pay | Admitting: Cardiology

## 2024-07-14 DIAGNOSIS — I10 Essential (primary) hypertension: Secondary | ICD-10-CM

## 2024-07-14 DIAGNOSIS — E785 Hyperlipidemia, unspecified: Secondary | ICD-10-CM

## 2024-07-14 LAB — TSH: TSH: 0.9 u[IU]/mL (ref 0.450–4.500)

## 2024-07-14 LAB — BASIC METABOLIC PANEL WITH GFR
BUN/Creatinine Ratio: 11 (ref 10–24)
BUN: 9 mg/dL (ref 8–27)
CO2: 21 mmol/L (ref 20–29)
Calcium: 9 mg/dL (ref 8.6–10.2)
Chloride: 104 mmol/L (ref 96–106)
Creatinine, Ser: 0.79 mg/dL (ref 0.76–1.27)
Glucose: 84 mg/dL (ref 70–99)
Potassium: 4.1 mmol/L (ref 3.5–5.2)
Sodium: 145 mmol/L — ABNORMAL HIGH (ref 134–144)
eGFR: 101 mL/min/1.73

## 2024-07-14 LAB — LIPID PANEL
Chol/HDL Ratio: 2.1 ratio (ref 0.0–5.0)
Cholesterol, Total: 190 mg/dL (ref 100–199)
HDL: 91 mg/dL
LDL Chol Calc (NIH): 81 mg/dL (ref 0–99)
Triglycerides: 101 mg/dL (ref 0–149)
VLDL Cholesterol Cal: 18 mg/dL (ref 5–40)

## 2024-07-14 LAB — MAGNESIUM: Magnesium: 1.7 mg/dL (ref 1.6–2.3)

## 2024-07-16 ENCOUNTER — Ambulatory Visit

## 2024-07-16 DIAGNOSIS — R55 Syncope and collapse: Secondary | ICD-10-CM | POA: Diagnosis not present

## 2024-07-17 MED ORDER — EZETIMIBE 10 MG PO TABS: 10.0000 mg | ORAL_TABLET | Freq: Every day | ORAL | 3 refills | Status: AC

## 2024-07-17 MED ORDER — MAGNESIUM OXIDE -MG SUPPLEMENT 400 (240 MG) MG PO TABS: 400.0000 mg | ORAL_TABLET | Freq: Every day | ORAL | 3 refills | Status: AC

## 2024-07-18 LAB — CUP PACEART REMOTE DEVICE CHECK
Date Time Interrogation Session: 20251220232907
Implantable Pulse Generator Implant Date: 20250123

## 2024-07-19 NOTE — Progress Notes (Signed)
 Remote Loop Recorder Transmission

## 2024-07-22 ENCOUNTER — Ambulatory Visit: Payer: Self-pay | Admitting: Cardiovascular Disease

## 2024-08-16 ENCOUNTER — Ambulatory Visit

## 2024-08-16 DIAGNOSIS — R55 Syncope and collapse: Secondary | ICD-10-CM | POA: Diagnosis not present

## 2024-08-17 LAB — CUP PACEART REMOTE DEVICE CHECK
Date Time Interrogation Session: 20260120233419
Implantable Pulse Generator Implant Date: 20250123

## 2024-08-19 NOTE — Progress Notes (Signed)
 Remote Loop Recorder Transmission

## 2024-08-20 ENCOUNTER — Ambulatory Visit: Payer: Self-pay | Admitting: Cardiovascular Disease

## 2024-08-22 ENCOUNTER — Ambulatory Visit (HOSPITAL_COMMUNITY): Admission: RE | Admit: 2024-08-22 | Source: Ambulatory Visit | Attending: Cardiology | Admitting: Cardiology

## 2024-08-22 ENCOUNTER — Encounter (HOSPITAL_COMMUNITY): Payer: Self-pay | Admitting: Cardiology

## 2024-09-16 ENCOUNTER — Ambulatory Visit

## 2024-10-17 ENCOUNTER — Ambulatory Visit

## 2024-11-17 ENCOUNTER — Ambulatory Visit

## 2024-12-18 ENCOUNTER — Ambulatory Visit

## 2025-01-18 ENCOUNTER — Ambulatory Visit

## 2025-02-18 ENCOUNTER — Ambulatory Visit

## 2025-03-21 ENCOUNTER — Ambulatory Visit

## 2025-04-21 ENCOUNTER — Ambulatory Visit

## 2025-05-22 ENCOUNTER — Ambulatory Visit

## 2025-06-22 ENCOUNTER — Ambulatory Visit

## 2025-07-23 ENCOUNTER — Ambulatory Visit
